# Patient Record
Sex: Female | Born: 1960 | Race: White | Hispanic: No | Marital: Married | Smoking: Former smoker
Health system: Southern US, Community
[De-identification: ages and names within clinical notes are randomized; demographics above are authoritative.]

## PROBLEM LIST (undated history)

## (undated) DIAGNOSIS — D219 Benign neoplasm of connective and other soft tissue, unspecified: Secondary | ICD-10-CM

## (undated) DIAGNOSIS — T8859XA Other complications of anesthesia, initial encounter: Secondary | ICD-10-CM

## (undated) DIAGNOSIS — Z9109 Other allergy status, other than to drugs and biological substances: Secondary | ICD-10-CM

## (undated) DIAGNOSIS — R55 Syncope and collapse: Secondary | ICD-10-CM

## (undated) DIAGNOSIS — I341 Nonrheumatic mitral (valve) prolapse: Secondary | ICD-10-CM

## (undated) DIAGNOSIS — Z973 Presence of spectacles and contact lenses: Secondary | ICD-10-CM

## (undated) HISTORY — DX: Nonrheumatic mitral (valve) prolapse: I34.1

## (undated) HISTORY — DX: Benign neoplasm of connective and other soft tissue, unspecified: D21.9

## (undated) HISTORY — DX: Syncope and collapse: R55

## (undated) HISTORY — DX: Other allergy status, other than to drugs and biological substances: Z91.09

---

## 1998-12-23 ENCOUNTER — Other Ambulatory Visit: Admission: RE | Admit: 1998-12-23 | Discharge: 1998-12-23 | Payer: Self-pay | Admitting: Gynecology

## 1999-04-26 HISTORY — PX: MYOMECTOMY: SHX85

## 1999-11-19 ENCOUNTER — Ambulatory Visit (HOSPITAL_COMMUNITY): Admission: RE | Admit: 1999-11-19 | Discharge: 1999-11-19 | Payer: Self-pay | Admitting: Gynecology

## 1999-11-19 ENCOUNTER — Encounter (INDEPENDENT_AMBULATORY_CARE_PROVIDER_SITE_OTHER): Payer: Self-pay | Admitting: Specialist

## 1999-12-06 ENCOUNTER — Other Ambulatory Visit: Admission: RE | Admit: 1999-12-06 | Discharge: 1999-12-06 | Payer: Self-pay | Admitting: Gynecology

## 2000-11-09 ENCOUNTER — Other Ambulatory Visit: Admission: RE | Admit: 2000-11-09 | Discharge: 2000-11-09 | Payer: Self-pay | Admitting: Gynecology

## 2001-12-12 ENCOUNTER — Other Ambulatory Visit: Admission: RE | Admit: 2001-12-12 | Discharge: 2001-12-12 | Payer: Self-pay | Admitting: Gynecology

## 2002-11-29 ENCOUNTER — Encounter: Payer: Self-pay | Admitting: Gynecology

## 2002-11-29 ENCOUNTER — Encounter: Admission: RE | Admit: 2002-11-29 | Discharge: 2002-11-29 | Payer: Self-pay | Admitting: Gynecology

## 2002-12-25 ENCOUNTER — Other Ambulatory Visit: Admission: RE | Admit: 2002-12-25 | Discharge: 2002-12-25 | Payer: Self-pay | Admitting: Gynecology

## 2003-12-31 ENCOUNTER — Other Ambulatory Visit: Admission: RE | Admit: 2003-12-31 | Discharge: 2003-12-31 | Payer: Self-pay | Admitting: Gynecology

## 2004-10-08 ENCOUNTER — Encounter: Admission: RE | Admit: 2004-10-08 | Discharge: 2004-10-08 | Payer: Self-pay | Admitting: Gynecology

## 2005-01-04 ENCOUNTER — Other Ambulatory Visit: Admission: RE | Admit: 2005-01-04 | Discharge: 2005-01-04 | Payer: Self-pay | Admitting: Gynecology

## 2005-12-07 ENCOUNTER — Encounter: Admission: RE | Admit: 2005-12-07 | Discharge: 2005-12-07 | Payer: Self-pay | Admitting: Gynecology

## 2006-01-03 ENCOUNTER — Other Ambulatory Visit: Admission: RE | Admit: 2006-01-03 | Discharge: 2006-01-03 | Payer: Self-pay | Admitting: Gynecology

## 2007-01-09 ENCOUNTER — Other Ambulatory Visit: Admission: RE | Admit: 2007-01-09 | Discharge: 2007-01-09 | Payer: Self-pay | Admitting: Gynecology

## 2007-10-10 ENCOUNTER — Encounter: Admission: RE | Admit: 2007-10-10 | Discharge: 2007-10-10 | Payer: Self-pay | Admitting: Gynecology

## 2008-12-24 ENCOUNTER — Emergency Department (HOSPITAL_COMMUNITY): Admission: EM | Admit: 2008-12-24 | Discharge: 2008-12-25 | Payer: Self-pay | Admitting: Emergency Medicine

## 2009-01-22 ENCOUNTER — Encounter: Admission: RE | Admit: 2009-01-22 | Discharge: 2009-01-22 | Payer: Self-pay | Admitting: Gynecology

## 2009-04-25 DIAGNOSIS — R55 Syncope and collapse: Secondary | ICD-10-CM

## 2009-04-25 HISTORY — DX: Syncope and collapse: R55

## 2010-03-29 ENCOUNTER — Encounter: Admission: RE | Admit: 2010-03-29 | Discharge: 2010-03-29 | Payer: Self-pay | Admitting: Gynecology

## 2010-05-16 ENCOUNTER — Encounter: Payer: Self-pay | Admitting: Gynecology

## 2010-07-30 LAB — POCT I-STAT, CHEM 8
BUN: 15 mg/dL (ref 6–23)
Calcium, Ion: 1.12 mmol/L (ref 1.12–1.32)
Chloride: 101 mEq/L (ref 96–112)
Creatinine, Ser: 0.7 mg/dL (ref 0.4–1.2)
Glucose, Bld: 104 mg/dL — ABNORMAL HIGH (ref 70–99)
HCT: 40 % (ref 36.0–46.0)
Hemoglobin: 13.6 g/dL (ref 12.0–15.0)
Potassium: 3.7 meq/L (ref 3.5–5.1)
Sodium: 138 mEq/L (ref 135–145)
TCO2: 26 mmol/L (ref 0–100)

## 2010-07-30 LAB — URINALYSIS, ROUTINE W REFLEX MICROSCOPIC
Glucose, UA: NEGATIVE mg/dL
Protein, ur: NEGATIVE mg/dL
pH: 5.5 (ref 5.0–8.0)

## 2010-07-30 LAB — POCT PREGNANCY, URINE: Preg Test, Ur: NEGATIVE

## 2010-07-30 LAB — URINE CULTURE: Colony Count: 5000

## 2010-08-31 ENCOUNTER — Encounter: Payer: Self-pay | Admitting: Cardiovascular Disease

## 2010-09-03 ENCOUNTER — Ambulatory Visit: Payer: Self-pay | Admitting: Cardiovascular Disease

## 2010-09-07 ENCOUNTER — Telehealth: Payer: Self-pay | Admitting: Cardiovascular Disease

## 2010-09-07 MED ORDER — PROPRANOLOL HCL 10 MG PO TABS: 10.0000 mg | ORAL_TABLET | ORAL | Status: DC | PRN

## 2010-09-07 NOTE — Telephone Encounter (Signed)
Pt wants a refill of propranolol called to [facility]

## 2010-09-10 NOTE — Op Note (Signed)
[facility]  Patient:    Tara James, Tara James                   MRN: [id number] Proc. Date: 11/19/99 Adm. Date:  [id number] Disc. Date: [id number] Attending:  Katrina Stack CC:         Gretta Cool, M.D. 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  PROCEDURE:  Hysteroscopy, resection of luminal leiomyoma x 2.  SURGEON:  Gretta Cool, M.D.  ANESTHESIA:  IV sedation and paracervical block.  DESCRIPTION OF PROCEDURE:  Under excellent IV sedation with paracervical block, the cervix was progressively dilated with a series of Pratt dilators to accommodate a 7 mm resectoscope. The endometrial cavity was then systematically examined. At the apex of the fundus, a very large fibroid bulged into the cavity.  It occupied virtually the entire upper portion of the endometrial cavity.  Photographs were taken.  It had an extremely vascular surface, lobular white leiomyoma was characteristic of garden variety fibroids.  The fibroid was then resected progressively with 90 degree resectoscope loop.  Once the entire fibroid had been removed to the base of the myometrium, a further resection was taken down into the myometrium to be certain that all of the leiomyoma tissue was removed, including its attachment into the muscle wall.  On careful examination of the remainder of the endometrial cavity, a second fibroid, just under the surface of the endometrium was located adjacent to the initial one.  The fibroid was then carefully shaved and enucleated until it was entirely removed.  There was no significant excessive bleeding.  at reduced pressure at the end of the procedure, the  bleeding was well controlled.  No significant endometrial resection was undertaken except for immediately over the leiomyomata, so as to enucleate the fibroids.  At the end of the procedure sponge and lap counts were correct.  There were no complications.  Tissue submitted to pathology. DD:  11/19/99 TD:  11/22/99 Job: [id number]

## 2010-09-16 DIAGNOSIS — R002 Palpitations: Secondary | ICD-10-CM

## 2010-09-23 ENCOUNTER — Encounter: Payer: Self-pay | Admitting: Cardiovascular Disease

## 2010-09-23 ENCOUNTER — Ambulatory Visit (INDEPENDENT_AMBULATORY_CARE_PROVIDER_SITE_OTHER): Payer: 59 | Admitting: Cardiovascular Disease

## 2010-09-23 VITALS — BP 104/74 | HR 65 | Ht 67.0 in | Wt 115.0 lb

## 2010-09-23 DIAGNOSIS — I4949 Other premature depolarization: Secondary | ICD-10-CM

## 2010-09-23 DIAGNOSIS — I341 Nonrheumatic mitral (valve) prolapse: Secondary | ICD-10-CM

## 2010-09-23 DIAGNOSIS — I059 Rheumatic mitral valve disease, unspecified: Secondary | ICD-10-CM

## 2010-09-23 DIAGNOSIS — R002 Palpitations: Secondary | ICD-10-CM

## 2010-09-23 DIAGNOSIS — I493 Ventricular premature depolarization: Secondary | ICD-10-CM | POA: Insufficient documentation

## 2010-09-23 HISTORY — DX: Nonrheumatic mitral (valve) prolapse: I34.1

## 2010-09-23 MED ORDER — PROPRANOLOL HCL 10 MG PO TABS: 10.0000 mg | ORAL_TABLET | Freq: Four times a day (QID) | ORAL | Status: DC | PRN

## 2010-09-23 NOTE — Progress Notes (Signed)
Tara James Date of Birth  June 25, 1960 [facility] [address]     [address] [place], [place]  [address] [phone]  Fax  [phone]  History of Present Illness:  Tara James is a middle-age female with a history of mitral valve prolapse and palpitations. She has had lots of palpitations which clinically sound like premature ventricular contractions.  She has not had any further episodes of syncope or presyncope. She's been quite busy at work. She has not been sleeping well. She's been under considerable stress and noticed that she's drinking too much caffeine.  Current Outpatient Prescriptions on File Prior to Visit  Medication Sig Dispense Refill  . DISCONTD: propranolol (INDERAL) 10 MG tablet Take 1 tablet (10 mg total) by mouth as needed.  15 tablet  1    Allergies  Allergen Reactions  . Penicillins     Past Medical History  Diagnosis Date  . MVP (mitral valve prolapse)   . Syncope and collapse     No past surgical history on file.  History  Smoking status  . Former Smoker  . Quit date: 08/30/1985  Smokeless tobacco  . Not on file    History  Alcohol Use No    No family history on file.  Reviw of Systems:  Reviewed in the HPI.  All other systems are negative.  Physical Exam: BP 104/74  Pulse 65  Ht 5\' 7"  (1.702 m)  Wt 115 lb (52.164 kg)  BMI 18.01 kg/m2 The patient is alert and oriented x 3.  The mood and affect are normal.  The skin is warm and dry.  Color is normal.  The HEENT exam reveals that the sclera are nonicteric.  The mucous membranes are moist.  The carotids are 2+ without bruits.  There is no thyromegaly.  There is no JVD.  The lungs are clear.  The chest wall is non tender.  The heart exam reveals a regular rate with a normal S1 and S2.  There is a soft mid systolic click.  The PMI is not displaced.   Abdominal exam reveals good bowel sounds.  There is no guarding or rebound.  There is no  hepatosplenomegaly or tenderness.  There are no masses.  Exam of the legs reveal no clubbing, cyanosis, or edema.  The legs are without rashes.  The distal pulses are intact.  Cranial nerves II - XII are intact.  Motor and sensory functions are intact.  The gait is normal.  ECG: Normal sinus rhythm. She has occasional premature ventricular contractions. Assessment / Plan:

## 2010-09-23 NOTE — Assessment & Plan Note (Signed)
Tara James presents with symptoms consistent with PVCs and was found have PVCs on her EKG. We have discussed the typical reasons for having PVCs including a thyroid abnormalities, eletrolyte abnormalities, excessive caffeine, and increased stress. Her thyroid levels were checked by her GYN doctor several months ago and they were normal. She admits to being under considerable stress because of her job. She also has not been sleeping well. We will go ahead and refill her propranolol which he can take on an as needed basis. I'll see her again in one year for followup visit. I've asked her to call me sooner if She does not improve significantly.

## 2010-09-23 NOTE — Assessment & Plan Note (Signed)
Stable

## 2011-09-29 ENCOUNTER — Encounter: Payer: Self-pay | Admitting: Cardiovascular Disease

## 2011-09-29 ENCOUNTER — Ambulatory Visit (INDEPENDENT_AMBULATORY_CARE_PROVIDER_SITE_OTHER): Payer: 59 | Admitting: Cardiovascular Disease

## 2011-09-29 VITALS — BP 110/82 | HR 65 | Ht 67.0 in | Wt 117.1 lb

## 2011-09-29 DIAGNOSIS — I493 Ventricular premature depolarization: Secondary | ICD-10-CM

## 2011-09-29 DIAGNOSIS — R5381 Other malaise: Secondary | ICD-10-CM

## 2011-09-29 DIAGNOSIS — R002 Palpitations: Secondary | ICD-10-CM

## 2011-09-29 DIAGNOSIS — R5383 Other fatigue: Secondary | ICD-10-CM

## 2011-09-29 DIAGNOSIS — I4949 Other premature depolarization: Secondary | ICD-10-CM

## 2011-09-29 LAB — CBC WITH DIFFERENTIAL/PLATELET
Eosinophils Absolute: 0.2 10*3/uL (ref 0.0–0.7)
HCT: 43.8 % (ref 36.0–46.0)
Hemoglobin: 14.7 g/dL (ref 12.0–15.0)
Lymphs Abs: 2.4 10*3/uL (ref 0.7–4.0)
MCHC: 33.7 g/dL (ref 30.0–36.0)
MCV: 94.2 fl (ref 78.0–100.0)
Monocytes Absolute: 0.5 10*3/uL (ref 0.1–1.0)
Monocytes Relative: 9.7 % (ref 3.0–12.0)
Neutrophils Relative %: 41.7 % — ABNORMAL LOW (ref 43.0–77.0)
RBC: 4.65 Mil/uL (ref 3.87–5.11)
WBC: 5.5 10*3/uL (ref 4.5–10.5)

## 2011-09-29 LAB — HEPATIC FUNCTION PANEL
ALT: 18 U/L (ref 0–35)
Albumin: 4.8 g/dL (ref 3.5–5.2)
Total Protein: 7.3 g/dL (ref 6.0–8.3)

## 2011-09-29 LAB — BASIC METABOLIC PANEL
BUN: 14 mg/dL (ref 6–23)
Calcium: 9.5 mg/dL (ref 8.4–10.5)
Creatinine, Ser: 0.6 mg/dL (ref 0.4–1.2)
GFR: 112.91 mL/min (ref >60.00)

## 2011-09-29 LAB — LIPID PANEL
Cholesterol: 181 mg/dL (ref 0–200)
HDL: 65.5 mg/dL (ref >39.00)
Triglycerides: 94 mg/dL (ref 0.0–149.0)

## 2011-09-29 NOTE — Progress Notes (Signed)
    Tara James Date of Birth  Apr 27, 1960       [facility]    [place] [address]  [address] [place], [place]  [address]   [place], [place]  [address] [phone]     [phone]   Fax  [phone]    Fax [phone]  Problem List:  1. Mitral Valve Proplapse 2. Palpitations  History of Present Illness:  Tara James presents today with increased stress , fatigue and palpitations.  She has been increased stress after her father - in -law Tara James) had a stroke.  She also has had lots of stress at work.  She exercises occasionally - has not had any chest pains or syncope with exercise.  Current Outpatient Prescriptions on File Prior to Visit  Medication Sig Dispense Refill  . CALCIUM PO Take by mouth daily.        . Cholecalciferol (VITAMIN D-3 PO) Take by mouth daily.        . Estradiol (VIVELLE TD) Place 0.75 mg onto the skin daily.       . Progesterone Micronized (PROMETRIUM PO) Take by mouth every 30 (thirty) days. Every other month       . propranolol (INDERAL) 10 MG tablet Take 1 tablet (10 mg total) by mouth 4 (four) times daily as needed.  50 tablet  12    Allergies  Allergen Reactions  . Penicillins     Past Medical History  Diagnosis Date  . MVP (mitral valve prolapse)   . Syncope and collapse     No past surgical history on file.  History  Smoking status  . Former Smoker  . Quit date: 08/30/1985  Smokeless tobacco  . Not on file    History  Alcohol Use No    No family history on file.  Reviw of Systems:  Reviewed in the HPI.  All other systems are negative.  Physical Exam: Blood pressure 110/82, pulse 65, height 5\' 7"  (1.702 m), weight 117 lb 1.9 oz (53.125 kg). General: Well developed, well nourished, in no acute distress.  Head: Normocephalic, atraumatic, sclera non-icteric, mucus membranes are moist,   Neck: Supple. Carotids are 2 + without bruits. No JVD  Lungs: Clear bilaterally to  auscultation.  Heart: regular rate.  normal  S1 S2. She has a very soft systolic murmur.  Abdomen: Soft, non-tender, non-distended with normal bowel sounds. No hepatomegaly. No rebound/guarding. No masses.  Msk:  Strength and tone are normal  Extremities: No clubbing or cyanosis. No edema.  Distal pedal pulses are 2+ and equal bilaterally.  Neuro: Alert and oriented X 3. Moves all extremities spontaneously.  Psych:  Responds to questions appropriately with a normal affect.  ECG: 09/29/2011 normal sinus rhythm. She has a vertical axis consistent with her thin  body frame. She has no ST or T wave changes.  Assessment / Plan:

## 2011-09-29 NOTE — Patient Instructions (Signed)
Your physician recommends that you return for lab work in: TODAY, TSH/BMET/CBC/LIPID/HEPATIC.   Your physician wants you to follow-up in: 1 YEAR, You will receive a reminder letter in the mail two months in advance. If you don't receive a letter, please call our office to schedule the follow-up appointment.  CALL WITH FURTHER CONCERNS

## 2011-09-29 NOTE — Assessment & Plan Note (Signed)
She continues to have palpitations that are probably due to PVCs.  She episodes last only a second or so and have not caused her to be short of breath.  We discussed placing an event monitor on her. We will check TSH, BMP, CBC and fasting lipids to further evaluate these palpitations and her generalized fatigue.

## 2011-10-05 ENCOUNTER — Encounter: Payer: Self-pay | Admitting: Internal Medicine

## 2011-10-05 ENCOUNTER — Ambulatory Visit (INDEPENDENT_AMBULATORY_CARE_PROVIDER_SITE_OTHER): Payer: 59 | Admitting: Internal Medicine

## 2011-10-05 VITALS — BP 100/68 | HR 57 | Temp 97.5°F | Resp 16 | Ht 67.0 in | Wt 115.0 lb

## 2011-10-05 DIAGNOSIS — E8941 Symptomatic postprocedural ovarian failure: Secondary | ICD-10-CM

## 2011-10-05 DIAGNOSIS — R002 Palpitations: Secondary | ICD-10-CM

## 2011-10-05 DIAGNOSIS — F430 Acute stress reaction: Secondary | ICD-10-CM

## 2011-10-05 DIAGNOSIS — I341 Nonrheumatic mitral (valve) prolapse: Secondary | ICD-10-CM

## 2011-10-05 DIAGNOSIS — I059 Rheumatic mitral valve disease, unspecified: Secondary | ICD-10-CM

## 2011-10-05 DIAGNOSIS — F439 Reaction to severe stress, unspecified: Secondary | ICD-10-CM

## 2011-10-05 MED ORDER — ALPRAZOLAM 0.5 MG PO TABS: 0.2500 mg | ORAL_TABLET | Freq: Every evening | ORAL | Status: DC | PRN

## 2011-10-06 ENCOUNTER — Encounter: Payer: Self-pay | Admitting: Internal Medicine

## 2011-10-06 DIAGNOSIS — R002 Palpitations: Secondary | ICD-10-CM | POA: Insufficient documentation

## 2011-10-06 DIAGNOSIS — Z780 Asymptomatic menopausal state: Secondary | ICD-10-CM | POA: Insufficient documentation

## 2011-10-06 NOTE — Progress Notes (Signed)
Subjective:    Patient ID: Tara James, female    DOB: [birth year], 51 y.o.   MRN: [id number]  HPI  Tara James is a new pt here to establish primary care.  PMH of MVP, palpitations , symptomatic menopause,  Jacki Cones notes she does have abd discomfort that she wonders if it is related to IBS.  She has recently eliminated gluten from her diet.    She has been under considerable stress at work.  There have been layoffs and she is working many hours.    She has been on HT form Dr.Lomax for about 2 years.   MGM had breast cancer, no other GYN cancers, no DVT or PE Father had CAD .  She has palpitations at time and notes she does drink caffiene.  No chest pain no SOD or DOE  Allergies  Allergen Reactions  . Penicillins    Past Medical History  Diagnosis Date  . MVP (mitral valve prolapse)   . Syncope and collapse   . Allergy to poison ivy   . Fibroid tumor    Past Surgical History  Procedure Date  . Myomectomy 2001   History   Social History  . Marital Status: Married    Spouse Name: N/A    Number of Children: N/A  . Years of Education: N/A   Occupational History  . Not on file.   Social History Main Topics  . Smoking status: Former Smoker -- 0.5 packs/day for 4 years    Types: Cigarettes    Quit date: 08/30/1985  . Smokeless tobacco: Never Used  . Alcohol Use: Yes     wine  . Drug Use: No  . Sexually Active: Yes -- Female partner(s)   Other Topics Concern  . Not on file   Social History Narrative  . No narrative on file   Family History  Problem Relation Age of Onset  . Diabetes Paternal Grandmother   . Heart disease Father   . Cancer Maternal Grandmother     breast   Patient Active Problem List  Diagnosis  . Premature ventricular contraction  . Mitral valve prolapse  . Menopause  . Palpitations   Current Outpatient Prescriptions on File Prior to Visit  Medication Sig Dispense Refill  . CALCIUM PO Take by mouth daily.        . Cholecalciferol (VITAMIN  D-3 PO) Take by mouth daily.        . Estradiol (VIVELLE TD) Place 0.75 mg onto the skin 2 (two) times a week.       . Progesterone Micronized (PROMETRIUM PO) Take by mouth. Ever 12 days a month every other month      . propranolol (INDERAL) 10 MG tablet Take 1 tablet (10 mg total) by mouth 4 (four) times daily as needed.  50 tablet  12      Review of Systems    see HPI Objective:   Physical Exam Physical Exam  Nursing note and vitals reviewed.  Constitutional: She is oriented to person, place, and time. She appears well-developed and well-nourished.  HENT:  Head: Normocephalic and atraumatic.  Cardiovascular: Normal rate and regular rhythm. Exam reveals no gallop and no friction rub.  No murmur heard.  Pulmonary/Chest: Breath sounds normal. She has no wheezes. She has no rales.  Neurological: She is alert and oriented to person, place, and time.  Skin: Skin is warm and dry.  Psychiatric: She has a normal mood and affect. Her behavior is normal.  Assessment & Plan:  1)  MVP/palpitations  Managed Dr. Melburn Popper  2)  Situational stress  3)  Menpause

## 2012-07-23 ENCOUNTER — Encounter: Payer: Self-pay | Admitting: Cardiovascular Disease

## 2012-07-28 ENCOUNTER — Encounter: Payer: Self-pay | Admitting: Internal Medicine

## 2012-07-28 DIAGNOSIS — E2839 Other primary ovarian failure: Secondary | ICD-10-CM | POA: Insufficient documentation

## 2012-07-28 DIAGNOSIS — D219 Benign neoplasm of connective and other soft tissue, unspecified: Secondary | ICD-10-CM | POA: Insufficient documentation

## 2012-08-06 ENCOUNTER — Encounter: Payer: Self-pay | Admitting: *Deleted

## 2012-08-13 ENCOUNTER — Telehealth: Payer: Self-pay | Admitting: Internal Medicine

## 2012-08-13 ENCOUNTER — Encounter: Payer: Self-pay | Admitting: Internal Medicine

## 2012-08-13 NOTE — Telephone Encounter (Signed)
Spoke with pt and counseled to get her mammogram as in old records from Dr. Nicholas Lose   I do not see a mammogram since 2011.  Pt voices understanding and counseled to have the [facility] send the mammogram report to me

## 2012-08-28 ENCOUNTER — Encounter: Payer: Self-pay | Admitting: *Deleted

## 2012-11-13 ENCOUNTER — Telehealth: Payer: Self-pay | Admitting: Cardiovascular Disease

## 2012-11-13 DIAGNOSIS — R002 Palpitations: Secondary | ICD-10-CM

## 2012-11-13 NOTE — Telephone Encounter (Signed)
Called pt 7/23 appt for event moniter.  10:30am  [facility] [facility] [facility] status: Elapsed Subscriber: Cecilie Kicks

## 2012-11-13 NOTE — Telephone Encounter (Signed)
New problem   Pt spoke to someone in triage earlier and has another question

## 2012-11-13 NOTE — Telephone Encounter (Signed)
New prob  Pt states that she has been having occasional palpitations. She said that she is having arrhythmias with a resting pulse of 42.

## 2012-11-13 NOTE — Telephone Encounter (Signed)
Patient called back. States she has been having skipped beats and palpitations on and off since Sunday. On some days she has no symptoms. Takes Inderal prn but these events are becoming bothersome to her. Saw Dr.Nahser last year for a work up and he mentioned having an event monitor which she did not complete last year. Advised I will place order for and event monitor and she will receive a call back from [facility] to set this up. I made her a return visit appointment with Dr.Nahser for 8/14 at 845 am. She was instructed to report to the ER if she has any dizziness or pre syncope with the skipped beats/palpitations.

## 2012-11-13 NOTE — Telephone Encounter (Signed)
Left message for call back.

## 2012-11-13 NOTE — Telephone Encounter (Signed)
I spoke with the pt and she said that she had to leave work due to skipped beats.  The pt has not taken Inderal at this time and was unsure if this is the appropriate time to take the medication.  I made the pt aware that it is appropriate to take the Inderal as prescribed. An order has been placed for an event monitor from her earlier call and she is scheduled to see Dr Elease Hashimoto.

## 2012-11-14 ENCOUNTER — Encounter: Payer: Self-pay | Admitting: *Deleted

## 2012-11-14 ENCOUNTER — Encounter (INDEPENDENT_AMBULATORY_CARE_PROVIDER_SITE_OTHER): Payer: 59

## 2012-11-14 DIAGNOSIS — R002 Palpitations: Secondary | ICD-10-CM

## 2012-11-14 NOTE — Progress Notes (Signed)
Patient ID: Tara James, female   DOB: 1960-05-23, 52 y.o.   MRN: [id number] E-cardio Bramer 30 day cardiac event monitor placed on patient.

## 2012-11-30 ENCOUNTER — Telehealth: Payer: Self-pay | Admitting: *Deleted

## 2012-11-30 NOTE — Telephone Encounter (Signed)
msg left to see if pt feels the fast heart rates seen on ecardio @170  bpm and wanted to see if she is using her propranolol? Asked her to call back and update me.

## 2012-12-03 ENCOUNTER — Telehealth: Payer: Self-pay | Admitting: Cardiovascular Disease

## 2012-12-03 NOTE — Telephone Encounter (Signed)
New prob  Pt returning a phone call from jodette

## 2012-12-03 NOTE — Telephone Encounter (Signed)
Pt has not been using the propranolol, she feels bad and wasn't sure if it was the med. On 11/29/12 @ 5:30 she was playing tennis. She has an app this week to discuss readings, pt is to wear the monitor for 1 more week.

## 2012-12-06 ENCOUNTER — Encounter: Payer: Self-pay | Admitting: Cardiovascular Disease

## 2012-12-06 ENCOUNTER — Telehealth: Payer: Self-pay | Admitting: Cardiovascular Disease

## 2012-12-06 ENCOUNTER — Ambulatory Visit (INDEPENDENT_AMBULATORY_CARE_PROVIDER_SITE_OTHER): Payer: 59 | Admitting: Cardiovascular Disease

## 2012-12-06 VITALS — BP 110/73 | HR 61 | Wt 119.0 lb

## 2012-12-06 DIAGNOSIS — R002 Palpitations: Secondary | ICD-10-CM

## 2012-12-06 DIAGNOSIS — R0989 Other specified symptoms and signs involving the circulatory and respiratory systems: Secondary | ICD-10-CM

## 2012-12-06 DIAGNOSIS — I059 Rheumatic mitral valve disease, unspecified: Secondary | ICD-10-CM

## 2012-12-06 DIAGNOSIS — I341 Nonrheumatic mitral (valve) prolapse: Secondary | ICD-10-CM

## 2012-12-06 LAB — CBC
HCT: 42.4 % (ref 36.0–46.0)
Hemoglobin: 14.5 g/dL (ref 12.0–15.0)
MCV: 91.3 fl (ref 78.0–100.0)
Platelets: 171 10*3/uL (ref 150.0–400.0)
RDW: 12.2 % (ref 11.5–14.6)
WBC: 5 10*3/uL (ref 4.5–10.5)

## 2012-12-06 LAB — BASIC METABOLIC PANEL
BUN: 17 mg/dL (ref 6–23)
CO2: 30 mEq/L (ref 19–32)
Creatinine, Ser: 0.6 mg/dL (ref 0.4–1.2)
GFR: 112.36 mL/min (ref >60.00)
Glucose, Bld: 83 mg/dL (ref 70–99)
Potassium: 4.2 mEq/L (ref 3.5–5.1)
Sodium: 137 mEq/L (ref 135–145)

## 2012-12-06 LAB — HEPATIC FUNCTION PANEL
ALT: 18 U/L (ref 0–35)
Total Protein: 7.1 g/dL (ref 6.0–8.3)

## 2012-12-06 NOTE — Patient Instructions (Signed)
Your physician has requested that you have an abdominal aorta duplex. During this test, an ultrasound is used to evaluate the aorta. Allow 30 minutes for this exam. Do not eat after midnight the day before and avoid carbonated beverages  Your physician has requested that you have an echocardiogram. Echocardiography is a painless test that uses sound waves to create images of your heart. It provides your doctor with information about the size and shape of your heart and how well your heart's chambers and valves are working. This procedure takes approximately one hour. There are no restrictions for this procedure.  Your physician recommends that you schedule a follow-up appointment in: 2 months   Your physician recommends that you return for lab work in: today tsh/bmet/hfp/ cbc

## 2012-12-06 NOTE — Telephone Encounter (Signed)
Pt's TSH is low suggesting that her symptoms are due to hyperthyroidism.   Needs referral to endocrinologist.

## 2012-12-06 NOTE — Progress Notes (Signed)
Tara James Date of Birth  [birth year]       [facility]    [place] [address]  [address] [place], [place]  [address]   [place], [place]  [address] [phone]     [phone]   Fax  [phone]    Fax [phone]  Problem List:  1. Mitral Valve Proplapse 2. Palpitations 3. Premature ventricular contractions.  History of Present Illness:  Tara James presents today with increased stress , fatigue and palpitations.  She has been increased stress after her father - in -law Tara James) had a stroke.  She also has had lots of stress at work.  She exercises occasionally - has not had any chest pains or syncope with exercise.  December 06, 2012:  Tara James presents today for follow up of her palpitations.  She has worn the 30 day event monitor which shows many PVCs.  She has not been running but has played tennis - she felt "horrible"  Yesterday, she had significant dyspne and fatigue - even doing very mild exercise such as walking the dog.    Current Outpatient Prescriptions on File Prior to Visit  Medication Sig Dispense Refill  . ALPRAZolam (XANAX) 0.5 MG tablet Take 0.5 tablets (0.25 mg total) by mouth at bedtime as needed.  10 tablet  0  . CALCIUM PO Take by mouth daily.        . Cholecalciferol (VITAMIN D-3 PO) Take by mouth daily.        . Estradiol (VIVELLE TD) Place 0.75 mg onto the skin 2 (two) times a week.       . Progesterone Micronized (PROMETRIUM PO) Take by mouth. Ever 12 days a month every other month      . propranolol (INDERAL) 10 MG tablet Take 1 tablet (10 mg total) by mouth 4 (four) times daily as needed.  50 tablet  12   No current facility-administered medications on file prior to visit.    Allergies  Allergen Reactions  . Penicillins     Past Medical History  Diagnosis Date  . MVP (mitral valve prolapse)   . Syncope and collapse   . Allergy to poison ivy   . Fibroid tumor     Past Surgical History   Procedure Laterality Date  . Myomectomy  2001    History  Smoking status  . Former Smoker -- 0.50 packs/day for 4 years  . Types: Cigarettes  . Quit date: 08/30/1985  Smokeless tobacco  . Never Used    History  Alcohol Use  . Yes    Comment: wine    Family History  Problem Relation Age of Onset  . Diabetes Paternal Grandmother   . Heart disease Father   . Cancer Maternal Grandmother     breast    Reviw of Systems:  Reviewed in the HPI.  All other systems are negative.  Physical Exam: Blood pressure 110/73, pulse 61, weight 119 lb (53.978 kg). General: Well developed, well nourished, in no acute distress.  Head: Normocephalic, atraumatic, sclera non-icteric, mucus membranes are moist,   Neck: Supple. Carotids are 2 + without bruits. No JVD  Lungs: Clear bilaterally to auscultation.  Heart: regular rate.  normal  S1 S2. She has a very soft systolic murmur.  Abdomen: Soft, non-tender, non-distended with normal bowel sounds.  Thin, + palpable abdomina aorta.  + soft bruit.  Msk:  Strength and tone are normal  Extremities: No clubbing or cyanosis.  No edema.  Distal pedal pulses are 2+ and equal bilaterally.  Neuro: Alert and oriented X 3. Moves all extremities spontaneously.  Psych:  Responds to questions appropriately with a normal affect.  ECG: 09/29/2011 normal sinus rhythm. She has a vertical axis consistent with her thin  body frame. She has no ST or T wave changes.  Assessment / Plan:

## 2012-12-06 NOTE — Assessment & Plan Note (Signed)
Part presents today for further evaluation of her palpitations. She's been wearing a 30 day event monitor. She's been found to have frequent premature ventricular contractions. She has also noticed that she is more fatigued than usual and is occasionally short of breath with exercise that typically she does without problems.  The event monitor his been unremarkable so far. She's had occasional episodes/frequent episodes of premature ventricular contractions. She has not had any serious arrhythmias.  I suspect that she may be somewhat volume depleted. I've encouraged her to eat a little bit more food including more protein and more salt/potassium. Encouraged her to try eating a hard boiled egg with salt or an egg  white every morning for breakfast. I've recommended that she drink V8 juice in the afternoon.  It's been approximately 4 years since her last echocardiogram. We will repeat her echocardiogram to make sure that her mitral valve is stable. On exam it sounds the same as it did in the past.

## 2012-12-06 NOTE — Assessment & Plan Note (Signed)
I was able to hear a midline abdominal bruit. She's quite thin and it may be that  i am  just hearing the flow through the normal aorta. I would like to do  an abdominal duplex can't rule out renal artery stenosis or other arterial stenosis.  I doubt that she has an Abdominal aortic aneurysm despite the fact that I can palpate her abdominal bruit.

## 2012-12-10 ENCOUNTER — Telehealth: Payer: Self-pay | Admitting: *Deleted

## 2012-12-10 NOTE — Telephone Encounter (Signed)
Dr Elease Hashimoto contacted pt last week with TSH results and made her an app w/ [facility] for the following day, echo was cancelled per Dr Elease Hashimoto but will have abd duplex. Pt was called, she had app last week and more labs were done to see if this was a thyroiditis that would need treatment. mshe will hear further this week. Pt still c/o fatigue and sob with exertion.

## 2012-12-14 ENCOUNTER — Other Ambulatory Visit (HOSPITAL_COMMUNITY): Payer: 59

## 2012-12-14 ENCOUNTER — Encounter (INDEPENDENT_AMBULATORY_CARE_PROVIDER_SITE_OTHER): Payer: 59

## 2012-12-14 DIAGNOSIS — I341 Nonrheumatic mitral (valve) prolapse: Secondary | ICD-10-CM

## 2012-12-14 DIAGNOSIS — R002 Palpitations: Secondary | ICD-10-CM

## 2012-12-14 DIAGNOSIS — R0989 Other specified symptoms and signs involving the circulatory and respiratory systems: Secondary | ICD-10-CM

## 2013-01-16 ENCOUNTER — Other Ambulatory Visit: Payer: Self-pay

## 2013-01-16 DIAGNOSIS — Z1231 Encounter for screening mammogram for malignant neoplasm of breast: Secondary | ICD-10-CM

## 2013-02-12 ENCOUNTER — Ambulatory Visit
Admission: RE | Admit: 2013-02-12 | Discharge: 2013-02-12 | Disposition: A | Discharge status: Home / Self Care | Payer: 59 | Source: Ambulatory Visit

## 2013-02-12 DIAGNOSIS — Z1231 Encounter for screening mammogram for malignant neoplasm of breast: Secondary | ICD-10-CM

## 2013-02-15 ENCOUNTER — Encounter: Payer: Self-pay | Admitting: Cardiovascular Disease

## 2013-02-15 ENCOUNTER — Ambulatory Visit (INDEPENDENT_AMBULATORY_CARE_PROVIDER_SITE_OTHER): Payer: 59 | Admitting: Cardiovascular Disease

## 2013-02-15 ENCOUNTER — Encounter (INDEPENDENT_AMBULATORY_CARE_PROVIDER_SITE_OTHER): Payer: Self-pay

## 2013-02-15 VITALS — BP 110/74 | HR 54 | Ht 62.0 in | Wt 123.0 lb

## 2013-02-15 DIAGNOSIS — I341 Nonrheumatic mitral (valve) prolapse: Secondary | ICD-10-CM

## 2013-02-15 DIAGNOSIS — I059 Rheumatic mitral valve disease, unspecified: Secondary | ICD-10-CM

## 2013-02-15 DIAGNOSIS — R002 Palpitations: Secondary | ICD-10-CM

## 2013-02-15 DIAGNOSIS — R0989 Other specified symptoms and signs involving the circulatory and respiratory systems: Secondary | ICD-10-CM

## 2013-02-15 MED ORDER — PROPRANOLOL HCL 10 MG PO TABS: 10.0000 mg | ORAL_TABLET | Freq: Four times a day (QID) | ORAL | Status: DC | PRN

## 2013-02-15 NOTE — Patient Instructions (Signed)
Your physician wants you to follow-up in: 1 year  You will receive a reminder letter in the mail two months in advance. If you don't receive a letter, please call our office to schedule the follow-up appointment.  Your physician recommends that you continue on your current medications as directed. Please refer to the Current Medication list given to you today.  

## 2013-02-15 NOTE — Assessment & Plan Note (Signed)
stable °

## 2013-02-15 NOTE — Assessment & Plan Note (Signed)
Tara James is stable.  Will continue current meds.  I will see her again in 1 year.

## 2013-02-15 NOTE — Progress Notes (Signed)
Tara James Date of Birth  [birth year]       [facility]    [place] [address], [address]  [address] [place], [place]  [address]   [place], [place]  [address] [phone]     [phone]   Fax  [phone]    Fax [phone]  Problem List:  1. Mitral Valve Proplapse 2. Palpitations 3. Premature ventricular contractions.  History of Present Illness:  Tara James presents today with increased stress , fatigue and palpitations.  She has been increased stress after her father - in -law Tara James) had a stroke.  She also has had lots of stress at work.  She exercises occasionally - has not had any chest pains or syncope with exercise.  December 06, 2012:  Tara James presents today for follow up of her palpitations.  She has worn the 30 day event monitor which shows many PVCs.  She has not been running but has played tennis - she felt "horrible"  Yesterday, she had significant dyspne and fatigue - even doing very mild exercise such as walking the dog.    Oct. 24, 2014:  Tara James is doing better.  She had an abdominal duplex scan after her last visit because of an abd. Bruit - the scan was negative.   Current Outpatient Prescriptions on File Prior to Visit  Medication Sig Dispense Refill  . ALPRAZolam (XANAX) 0.5 MG tablet Take 0.5 tablets (0.25 mg total) by mouth at bedtime as needed.  10 tablet  0  . CALCIUM PO Take by mouth daily.        . Cholecalciferol (VITAMIN D-3 PO) Take by mouth daily.        . Estradiol (VIVELLE TD) Place 0.75 mg onto the skin 2 (two) times a week.       . Progesterone Micronized (PROMETRIUM PO) Take by mouth. Ever 12 days a month every other month      . propranolol (INDERAL) 10 MG tablet Take 1 tablet (10 mg total) by mouth 4 (four) times daily as needed.  50 tablet  12   No current facility-administered medications on file prior to visit.    Allergies  Allergen Reactions  . Penicillins     Past Medical History   Diagnosis Date  . MVP (mitral valve prolapse)   . Syncope and collapse   . Allergy to poison ivy   . Fibroid tumor     Past Surgical History  Procedure Laterality Date  . Myomectomy  2001    History  Smoking status  . Former Smoker -- 0.50 packs/day for 4 years  . Types: Cigarettes  . Quit date: 08/30/1985  Smokeless tobacco  . Never Used    History  Alcohol Use  . Yes    Comment: wine    Family History  Problem Relation Age of Onset  . Diabetes Paternal Grandmother   . Heart disease Father   . Cancer Maternal Grandmother     breast    Reviw of Systems:  Reviewed in the HPI.  All other systems are negative.  Physical Exam: Blood pressure 110/74, pulse 54, height 5\' 2"  (1.575 m), weight 123 lb (55.792 kg). General: Well developed, well nourished, in no acute distress.  Head: Normocephalic, atraumatic, sclera non-icteric, mucus membranes are moist,   Neck: Supple. Carotids are 2 + without bruits. No JVD  Lungs: Clear bilaterally to auscultation.  Heart: regular rate.  normal  S1 S2. She has a very  soft systolic murmur.  Abdomen: Soft, non-tender, non-distended with normal bowel sounds.  Thin, + palpable abdomina aorta.  + soft bruit.  Msk:  Strength and tone are normal  Extremities: No clubbing or cyanosis. No edema.  Distal pedal pulses are 2+ and equal bilaterally.  Neuro: Alert and oriented X 3. Moves all extremities spontaneously.  Psych:  Responds to questions appropriately with a normal affect.  ECG: 09/29/2011 normal sinus rhythm. She has a vertical axis consistent with her thin  body frame. She has no ST or T wave changes.  Assessment / Plan:

## 2013-02-15 NOTE — Assessment & Plan Note (Signed)
She is very thin and I am able to hear a soft abdomal bruit.  The abd. Duplex is negative.

## 2013-03-02 ENCOUNTER — Other Ambulatory Visit: Payer: Self-pay | Admitting: Cardiology

## 2013-03-02 DIAGNOSIS — F40243 Fear of flying: Secondary | ICD-10-CM

## 2013-03-02 DIAGNOSIS — F419 Anxiety disorder, unspecified: Secondary | ICD-10-CM

## 2013-03-02 MED ORDER — ALPRAZOLAM 0.5 MG PO TABS: 0.2500 mg | ORAL_TABLET | Freq: Every evening | ORAL | Status: DC | PRN

## 2013-03-02 NOTE — Progress Notes (Signed)
Patient called regarding refill of Xanax. She has anxiety and fear of flying. Dr. Elease Hashimoto has prescribed short term presriptions for Xanax in the past as he knows her well and follows her regularly. Patient states she is flying out of the country tomorrow and needs a refill on Xanax. I spoke with her primary cardiologist, Dr. Elease Hashimoto, who is on call with me today. Dr. Elease Hashimoto stated he is willing to provide her with a refill for her upcoming trip. He has done this for her in the past and follows her regularly. She has been counseled regarding the use of Xanax. A prescription refill was sent to her pharmacy. Xanax 0.5 mg - take 1/2 tablet daily at bedtime as needed. #10 (ten). No (0) refills.

## 2013-07-08 ENCOUNTER — Telehealth: Payer: Self-pay | Admitting: *Deleted

## 2013-07-08 ENCOUNTER — Other Ambulatory Visit (INDEPENDENT_AMBULATORY_CARE_PROVIDER_SITE_OTHER): Payer: 59

## 2013-07-08 ENCOUNTER — Telehealth: Payer: Self-pay | Admitting: Cardiovascular Disease

## 2013-07-08 DIAGNOSIS — N920 Excessive and frequent menstruation with regular cycle: Secondary | ICD-10-CM

## 2013-07-08 DIAGNOSIS — R002 Palpitations: Secondary | ICD-10-CM

## 2013-07-08 NOTE — Telephone Encounter (Signed)
Chart reviewed/ pt states x 1 week she has had frequent palpitations. Pt has not had TSH checked recently, she will call her PCP and get a lab draw. Pt was told to call with further need, she agreed to plan.

## 2013-07-08 NOTE — Telephone Encounter (Signed)
See prior note// pt was told to call pcp because last year her tsh was abn. Her event monitor was normal / freq PVC's. She called and they wouldn't get labs until she had an app and they couldn't get her in soon. I made her a lab app today/ bmet, TSH, cbc.  She denies other sx like sob but has had a menses which is unusual for her/ has not had one for awhile.

## 2013-07-08 NOTE — Telephone Encounter (Signed)
New message      Having same symptoms as last summer----heart palpitations--need advise

## 2013-07-09 LAB — BASIC METABOLIC PANEL
BUN: 19 mg/dL (ref 6–23)
CHLORIDE: 100 meq/L (ref 96–112)
CO2: 35 meq/L — AB (ref 19–32)
CREATININE: 0.7 mg/dL (ref 0.4–1.2)
Calcium: 9.3 mg/dL (ref 8.4–10.5)
GFR: 102.21 mL/min (ref >60.00)
GLUCOSE: 102 mg/dL — AB (ref 70–99)
Potassium: 3.7 mEq/L (ref 3.5–5.1)
Sodium: 137 mEq/L (ref 135–145)

## 2013-07-09 LAB — CBC
HEMATOCRIT: 41.1 % (ref 36.0–46.0)
HEMOGLOBIN: 13.9 g/dL (ref 12.0–15.0)
MCHC: 33.9 g/dL (ref 30.0–36.0)
MCV: 93.3 fl (ref 78.0–100.0)
Platelets: 176 10*3/uL (ref 150.0–400.0)
RBC: 4.41 Mil/uL (ref 3.87–5.11)
RDW: 12.4 % (ref 11.5–14.6)
WBC: 8.2 10*3/uL (ref 4.5–10.5)

## 2013-07-09 LAB — TSH: TSH: 1.61 u[IU]/mL (ref 0.35–5.50)

## 2013-07-09 NOTE — Telephone Encounter (Signed)
**Note De-Identified Tara James Obfuscation** Preliminarally discussed results with the pt. She is aware that Dr Acie Fredrickson has not reviewed her lab results yet.

## 2013-07-09 NOTE — Telephone Encounter (Signed)
Her TSH is now normal

## 2013-07-09 NOTE — Telephone Encounter (Signed)
New message  ° ° °Patient calling for test results.   °

## 2013-07-10 ENCOUNTER — Telehealth: Payer: Self-pay | Admitting: Cardiovascular Disease

## 2013-07-10 NOTE — Telephone Encounter (Signed)
New message ° ° ° ° ° °Returning a nurses call °

## 2013-07-10 NOTE — Telephone Encounter (Signed)
MSG LEFT/ labs are normal, continue to take propranolol, can call to schedule an app/ we can fit into schedule.

## 2013-07-10 NOTE — Telephone Encounter (Signed)
Pt will continue with propranolol daily while having this increased amount of palpitations.  Pt is not consuming caffeine and is not under additional stress, she states the palpitations are just annoying. She declines app at this time and declines using a beta blocker on a daily use. Pt will call when she would like to be seen or with further questions or concerns.

## 2013-07-31 ENCOUNTER — Ambulatory Visit (INDEPENDENT_AMBULATORY_CARE_PROVIDER_SITE_OTHER): Payer: 59 | Admitting: Internal Medicine

## 2013-07-31 ENCOUNTER — Encounter: Payer: Self-pay | Admitting: Internal Medicine

## 2013-07-31 VITALS — BP 107/60 | HR 75 | Temp 98.2°F | Resp 18 | Ht 67.0 in | Wt 121.0 lb

## 2013-07-31 DIAGNOSIS — Z139 Encounter for screening, unspecified: Secondary | ICD-10-CM

## 2013-07-31 DIAGNOSIS — R922 Inconclusive mammogram: Secondary | ICD-10-CM | POA: Insufficient documentation

## 2013-07-31 DIAGNOSIS — Z0000 Encounter for general adult medical examination without abnormal findings: Secondary | ICD-10-CM

## 2013-07-31 DIAGNOSIS — N951 Menopausal and female climacteric states: Secondary | ICD-10-CM

## 2013-07-31 DIAGNOSIS — Z780 Asymptomatic menopausal state: Secondary | ICD-10-CM

## 2013-07-31 DIAGNOSIS — N6459 Other signs and symptoms in breast: Secondary | ICD-10-CM

## 2013-07-31 DIAGNOSIS — N950 Postmenopausal bleeding: Secondary | ICD-10-CM | POA: Insufficient documentation

## 2013-07-31 LAB — POCT URINALYSIS DIPSTICK
Bilirubin, UA: NEGATIVE
GLUCOSE UA: NEGATIVE
Leukocytes, UA: NEGATIVE
Nitrite, UA: NEGATIVE
Protein, UA: NEGATIVE
RBC UA: NEGATIVE
SPEC GRAV UA: 1.01
UROBILINOGEN UA: NEGATIVE
pH, UA: 7

## 2013-07-31 LAB — HEMOCCULT GUIAC POC 1CARD (OFFICE): Fecal Occult Blood, POC: NEGATIVE

## 2013-07-31 MED ORDER — ESTRADIOL 0.075 MG/24HR TD PTTW: 1.0000 | MEDICATED_PATCH | TRANSDERMAL | Status: DC

## 2013-07-31 MED ORDER — PROGESTERONE MICRONIZED 100 MG PO CAPS: 100.0000 mg | ORAL_CAPSULE | Freq: Every day | ORAL | Status: DC

## 2013-07-31 NOTE — Progress Notes (Signed)
Subjective:    Patient ID: Tara James, female    DOB: 12-12-1960, 53 y.o.   MRN: [id number]  HPI  [name] is here for CPE    HM:  MM shows extreme breast density  .  She has no FH of breast cancer.  She is due for first screening colonoscopy    Had some frequent palpitations over summer.  Event moniter ([name]) showed PVC"S and TSH supressed at that time.  Repeat TSH just 3 weeks ago was normal on repeat testing .  She is asymptomatic now  In 05/2013 had vaginal bleeding for 5 days  LMP "years ago"    She reports she had post-menopausal FSH done by [name] in past.    She is on HT and taking prometrium for 12 days qomonth.  She has been on HY for about 3 years for bothersome menopausal flushing.   She reports her prior pap smears have been normal for many years  Allergies  Allergen Reactions  . Penicillins    Past Medical History  Diagnosis Date  . MVP (mitral valve prolapse)   . Syncope and collapse   . Allergy to poison ivy   . Fibroid tumor    Past Surgical History  Procedure Laterality Date  . Myomectomy  2001   History   Social History  . Marital Status: Married    Spouse Name: N/A    Number of Children: N/A  . Years of Education: N/A   Occupational History  . Not on file.   Social History Main Topics  . Smoking status: Former Smoker -- 0.50 packs/day for 4 years    Types: Cigarettes    Quit date: 08/30/1985  . Smokeless tobacco: Never Used  . Alcohol Use: Yes     Comment: wine  . Drug Use: No  . Sexual Activity: Yes    Partners: Male   Other Topics Concern  . Not on file   Social History Narrative  . No narrative on file   Family History  Problem Relation Age of Onset  . Diabetes Paternal Grandmother   . Heart disease Father   . Cancer Maternal Grandmother     breast   Patient Active Problem List   Diagnosis Date Noted  . Post-menopausal bleeding 07/31/2013  . Abdominal bruit 12/06/2012  . 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  Fibroids 07/28/2012  . Menopause 10/06/2011  . Palpitations 10/06/2011  . Premature ventricular contraction 09/23/2010  . Mitral valve prolapse 09/23/2010   Current Outpatient Prescriptions on File Prior to Visit  Medication Sig Dispense Refill  . CALCIUM PO Take by mouth daily.        . Cholecalciferol (VITAMIN D-3 PO) Take by mouth daily.        . Progesterone Micronized (PROMETRIUM PO) Take by mouth. Ever 12 days a month every other month      . propranolol (INDERAL) 10 MG tablet Take 1 tablet (10 mg total) by mouth 4 (four) times daily as needed.  50 tablet  12  . ALPRAZolam (XANAX) 0.5 MG tablet Take 0.5 tablets (0.25 mg total) by mouth at bedtime as needed.  10 tablet  0  . Estradiol (VIVELLE TD) Place 0.75 mg onto the skin 2 (two) times a week.        No current facility-administered medications on file prior to visit.       Review of Systems  Respiratory: Negative for choking and chest tightness.   Cardiovascular: Negative for  chest pain, palpitations and leg swelling.  Gastrointestinal: Negative for abdominal pain.  All other systems reviewed and are negative.      Objective:   Physical Exam  Physical Exam  Nursing note and vitals reviewed.  Constitutional: She is oriented to person, place, and time. She appears well-developed and well-nourished.  HENT:  Head: Normocephalic and atraumatic.  Right Ear: Tympanic membrane and ear canal normal. No drainage. Tympanic membrane is not injected and not erythematous.  Left Ear: Tympanic membrane and ear canal normal. No drainage. Tympanic membrane is not injected and not erythematous.  Nose: Nose normal. Right sinus exhibits no maxillary sinus tenderness and no frontal sinus tenderness. Left sinus exhibits no maxillary sinus tenderness and no frontal sinus tenderness.  Mouth/Throat: Oropharynx is clear and moist. No oral lesions. No oropharyngeal exudate.  Eyes: Conjunctivae and EOM are normal. Pupils are equal, round, and  reactive to light.  Neck: Normal range of motion. Neck supple. No JVD present. Carotid bruit is not present. No mass and no thyromegaly present.  Cardiovascular: Normal rate, regular rhythm, S1 normal, S2 normal and intact distal pulses. Exam reveals no gallop and no friction rub.  No murmur heard.  Pulses:  Carotid pulses are 2+ on the right side, and 2+ on the left side.  Dorsalis pedis pulses are 2+ on the right side, and 2+ on the left side.  No carotid bruit. No LE edema  Pulmonary/Chest: Breath sounds normal. She has no wheezes. She has no rales. She exhibits no tenderness.   Breast no discrete mass no nipple discharge no axillary adenopathy bilaterally Abdominal: Soft. Bowel sounds are normal. She exhibits no distension and no mass. There is no hepatosplenomegaly. There is no tenderness. There is no CVA tenderness.   Rectal no mass guaiac neg Musculoskeletal: Normal range of motion.  No active synovitis to joints.  Lymphadenopathy:  She has no cervical adenopathy.  She has no axillary adenopathy.  Right: No inguinal and no supraclavicular adenopathy present.  Left: No inguinal and no supraclavicular adenopathy present.  Neurological: She is alert and oriented to person, place, and time. She has normal strength and normal reflexes. She displays no tremor. No cranial nerve deficit or sensory deficit. Coordination and gait normal.  Skin: Skin is warm and dry. No rash noted. No cyanosis. Nails show no clubbing.  Psychiatric: She has a normal mood and affect. Her speech is normal and behavior is normal. Cognition and memory are normal.          Assessment & Plan:  HM  Will set up referral for sceening colonoscopy  See scanned sheet  Pap in 2-3 years  Quit smoking over 15 years ago.  Extreme breast density;  Low risk by FH  Advised 3 D mm next year  Vaginal spotting  Will get pelvic and TVUS.   Will change HT to prometrium 100 mg dialy with estradiol  .075 mg twice a week. Further  management based on results  TSH supression  Repeat testing normal will check today  PVC's palpitaitons  On Inderal  MVP  Spent 50 mins with face to face over 50% in counseling

## 2013-07-31 NOTE — Patient Instructions (Addendum)
Schedule colonoscopy  With Dr. Fuller Plan or Dr. Henrene Pastor    See me as needed  Schedule pelvic  TVUS  At [facility]

## 2013-09-09 ENCOUNTER — Ambulatory Visit (AMBULATORY_SURGERY_CENTER): Payer: Self-pay | Admitting: *Deleted

## 2013-09-09 VITALS — Ht 67.0 in | Wt 121.6 lb

## 2013-09-09 DIAGNOSIS — Z1211 Encounter for screening for malignant neoplasm of colon: Secondary | ICD-10-CM

## 2013-09-09 MED ORDER — MOVIPREP 100 G PO SOLR: ORAL | Status: DC

## 2013-09-09 NOTE — Progress Notes (Signed)
Patient denies any allergies to eggs or soy. Patient denies any problems with anesthesia/sedation. Patient does states post-op she had LOW BP. Patient denies any oxygen use at home and does not take any diet/weight loss medications. EMMI education assisgned to patient on colonoscopy, this was explained and instructions given to patient.

## 2013-09-16 ENCOUNTER — Encounter: Payer: Self-pay | Admitting: Internal Medicine

## 2013-09-16 DIAGNOSIS — M8580 Other specified disorders of bone density and structure, unspecified site: Secondary | ICD-10-CM | POA: Insufficient documentation

## 2013-09-20 ENCOUNTER — Encounter: Payer: Self-pay | Admitting: Internal Medicine

## 2013-09-23 ENCOUNTER — Ambulatory Visit (AMBULATORY_SURGERY_CENTER): Payer: 59 | Admitting: Internal Medicine

## 2013-09-23 ENCOUNTER — Encounter: Payer: Self-pay | Admitting: Internal Medicine

## 2013-09-23 VITALS — BP 103/60 | HR 40 | Temp 97.2°F | Resp 35 | Ht 67.0 in | Wt 121.0 lb

## 2013-09-23 DIAGNOSIS — Z1211 Encounter for screening for malignant neoplasm of colon: Secondary | ICD-10-CM

## 2013-09-23 HISTORY — PX: COLONOSCOPY: SHX174

## 2013-09-23 MED ORDER — SODIUM CHLORIDE 0.9 % IV SOLN: 500.0000 mL | INTRAVENOUS | Status: DC

## 2013-09-23 NOTE — Op Note (Signed)
[place]  [address] [place], [address]   COLONOSCOPY PROCEDURE REPORT  PATIENT: Tara James, Tara James  MR#: [id number] BIRTHDATE: [birth year] , 53  yrs. old GENDER: Female ENDOSCOPIST: Jerene Bears, MD REFERRED JO:[id number] Coralyn Mark, M.D. PROCEDURE DATE:  09/23/2013 PROCEDURE:   Colonoscopy, screening First Screening Colonoscopy - Avg.  risk and is 50 yrs.  old or older Yes.  Prior Negative Screening - Now for repeat screening. N/A  History of Adenoma - Now for follow-up colonoscopy & has been > or = to 3 yrs.  N/A  Polyps Removed Today? No.  Recommend repeat exam, <10 yrs? No. ASA CLASS:   Class II INDICATIONS:average risk screening and first colonoscopy. MEDICATIONS: MAC sedation, administered by CRNA and propofol (Diprivan) 300mg  IV  DESCRIPTION OF PROCEDURE:   After the risks benefits and alternatives of the procedure were thoroughly explained, informed consent was obtained.  A digital rectal exam revealed no rectal mass.   The LB [id number] and LB [id number]  endoscope was introduced through the anus and advanced to the cecum, which was identified by both the appendix and ileocecal valve. No adverse events experienced.   The quality of the prep was good, using MoviPrep  The instrument was then slowly withdrawn as the colon was fully examined.   COLON FINDINGS: There was mild scattered diverticulosis noted in the sigmoid colon.   The colon was otherwise normal.  There was no inflammation, polyps or cancers seen.  Retroflexed views revealed no abnormalities. The time to cecum=6 minutes 47 seconds. Withdrawal time=9 minutes 36 seconds.  The scope was withdrawn and the procedure completed. COMPLICATIONS: There were no complications.  ENDOSCOPIC IMPRESSION: 1.   There was mild diverticulosis noted in the sigmoid colon 2.   The colon was otherwise normal  RECOMMENDATIONS: You should continue to follow colorectal cancer screening  guidelines for "routine risk" patients with a repeat colonoscopy in 10 years. There is no need for FOBT (stool) testing for at least 5 years.   eSigned:  Jerene Bears, MD 09/23/2013 9:23 AM   cc: The Patient and Emi Belfast, MD

## 2013-09-23 NOTE — Progress Notes (Signed)
Pt stable to RR 

## 2013-09-23 NOTE — Patient Instructions (Addendum)
YOU HAD AN ENDOSCOPIC PROCEDURE TODAY AT [facility]: Refer to the procedure report that was given to you for any specific questions about what was found during the examination.  If the procedure report does not answer your questions, please call your gastroenterologist to clarify.  If you requested that your care partner not be given the details of your procedure findings, then the procedure report has been included in a sealed envelope for you to review at your convenience later.  YOU SHOULD EXPECT: Some feelings of bloating in the abdomen. Passage of more gas than usual.  Walking can help get rid of the air that was put into your GI tract during the procedure and reduce the bloating. If you had a lower endoscopy (such as a colonoscopy or flexible sigmoidoscopy) you may notice spotting of blood in your stool or on the toilet paper. If you underwent a bowel prep for your procedure, then you may not have a normal bowel movement for a few days.  DIET: Your first meal following the procedure should be a light meal and then it is ok to progress to your normal diet.  A half-sandwich or bowl of soup is an example of a good first meal.  Heavy or fried foods are harder to digest and may make you feel nauseous or bloated.  Likewise meals heavy in dairy and vegetables can cause extra gas to form and this can also increase the bloating.  Drink plenty of fluids but you should avoid alcoholic beverages for 24 hours.  Try to eat a high fiber diet to prevent Diverticulosis.  ACTIVITY: Your care partner should take you home directly after the procedure.  You should plan to take it easy, moving slowly for the rest of the day.  You can resume normal activity the day after the procedure however you should NOT DRIVE or use heavy machinery for 24 hours (because of the sedation medicines used during the test).    SYMPTOMS TO REPORT IMMEDIATELY: A gastroenterologist can be reached at any hour.  During normal  business hours, 8:30 AM to 5:00 PM Monday through Friday, call [phone].  After hours and on weekends, please call the GI answering service at [phone] who will take a message and have the physician on call contact you.   Following lower endoscopy (colonoscopy or flexible sigmoidoscopy):  Excessive amounts of blood in the stool  Significant tenderness or worsening of abdominal pains  Swelling of the abdomen that is new, acute  Fever of 100F or higher  FOLLOW UP: If any biopsies were taken you will be contacted by phone or by letter within the next 1-3 weeks.  Call your gastroenterologist if you have not heard about the biopsies in 3 weeks.  Our staff will call the home number listed on your records the next business day following your procedure to check on you and address any questions or concerns that you may have at that time regarding the information given to you following your procedure. This is a courtesy call and so if there is no answer at the home number and we have not heard from you through the emergency physician on call, we will assume that you have returned to your regular daily activities without incident.  SIGNATURES/CONFIDENTIALITY: You and/or your care partner have signed paperwork which will be entered into your electronic medical record.  These signatures attest to the fact that that the information above on your After Visit Summary has been reviewed  and is understood.  Full responsibility of the confidentiality of this discharge information lies with you and/or your care-partner.  Do share the patient's HR strip with the Cardiologist when you see him/her next time, and go to the ER if you have symptoms.

## 2013-09-23 NOTE — Progress Notes (Signed)
Patient came into recovery with a type of block hr.  Her heart rate went down to 41 and was irregular.   I told Dr. Hilarie Fredrickson, and no orders were received.  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   I gave him a strip of the HR and told him to share that with Cardiology.  He stated that it "wasn't necessary"...that the patient was "fine."  She is asymptomatic at this time. Pink and arousable.  Talking to me.  She states that her heartrate always goes down when she has any type of sedation.

## 2013-09-24 ENCOUNTER — Telehealth: Payer: Self-pay | Admitting: *Deleted

## 2013-09-24 ENCOUNTER — Encounter: Payer: Self-pay | Admitting: Internal Medicine

## 2013-09-24 DIAGNOSIS — K5790 Diverticulosis of intestine, part unspecified, without perforation or abscess without bleeding: Secondary | ICD-10-CM | POA: Insufficient documentation

## 2013-09-24 NOTE — Telephone Encounter (Signed)
  Follow up Call-  Call back number 09/23/2013  Post procedure Call Back phone  # [phone]  Permission to leave phone message Yes     Patient questions:  Do you have a fever, pain , or abdominal swelling? no Pain Score  0 *  Have you tolerated food without any problems? yes  Have you been able to return to your normal activities? yes  Do you have any questions about your discharge instructions: Diet   no Medications  no Follow up visit  no  Do you have questions or concerns about your Care? no  Actions: * If pain score is 4 or above: No action needed, pain <4.

## 2013-10-30 ENCOUNTER — Telehealth: Payer: Self-pay | Admitting: *Deleted

## 2013-10-30 MED ORDER — ESTRADIOL 0.075 MG/24HR TD PTTW: 1.0000 | MEDICATED_PATCH | TRANSDERMAL | Status: DC

## 2013-10-30 NOTE — Telephone Encounter (Signed)
Refilled Estradiol Patch

## 2013-12-09 ENCOUNTER — Other Ambulatory Visit: Payer: Self-pay | Admitting: *Deleted

## 2013-12-09 MED ORDER — PROGESTERONE MICRONIZED 100 MG PO CAPS: 100.0000 mg | ORAL_CAPSULE | Freq: Every day | ORAL | Status: DC

## 2013-12-09 NOTE — Telephone Encounter (Signed)
Received fax requesting refill

## 2013-12-09 NOTE — Telephone Encounter (Signed)
Tara James    Call pt and let her know that I  refillled 30 days of her prometrium only.  I ordered a pelvic and TVUS back in April because she had vaginal bleeding in 05/2013 .  I cannot give her any more hormones until I know the result of her ultrasound  Route back with her response   .

## 2013-12-11 NOTE — Telephone Encounter (Signed)
Called pt & l/m for a return call to give Dr's instructions.

## 2014-01-03 ENCOUNTER — Telehealth: Payer: Self-pay

## 2014-01-03 NOTE — Telephone Encounter (Signed)
[place] [phone] [facility] [place], [place]  Alazne called to request a refill for her ALPRAZolam Duanne Moron) 0.5 MG tablet, she is going out of town on Wednesday and would like to have some to take with her. She would like a call on Monday.

## 2014-01-06 ENCOUNTER — Other Ambulatory Visit: Payer: Self-pay | Admitting: *Deleted

## 2014-01-06 DIAGNOSIS — F419 Anxiety disorder, unspecified: Secondary | ICD-10-CM

## 2014-01-06 DIAGNOSIS — F40243 Fear of flying: Secondary | ICD-10-CM

## 2014-01-06 MED ORDER — ALPRAZOLAM 0.5 MG PO TABS: 0.2500 mg | ORAL_TABLET | Freq: Every evening | ORAL | Status: DC | PRN

## 2014-01-06 NOTE — Telephone Encounter (Signed)
Refill request for Xanax.  

## 2014-01-06 NOTE — Telephone Encounter (Signed)
I called pharmacy and verified that patient did pick up the RX on 01-04-14.-eh

## 2014-01-06 NOTE — Telephone Encounter (Signed)
Margaretha Sheffield  I called this in to pharmacy yesterday.  Just call and clarify with them

## 2014-01-17 ENCOUNTER — Other Ambulatory Visit: Payer: Self-pay | Admitting: *Deleted

## 2014-01-17 MED ORDER — PROGESTERONE MICRONIZED 100 MG PO CAPS: 100.0000 mg | ORAL_CAPSULE | Freq: Every day | ORAL | Status: DC

## 2014-01-17 MED ORDER — ESTRADIOL 0.075 MG/24HR TD PTTW: 1.0000 | MEDICATED_PATCH | TRANSDERMAL | Status: DC

## 2014-02-17 ENCOUNTER — Encounter: Payer: Self-pay | Admitting: Internal Medicine

## 2014-02-17 ENCOUNTER — Ambulatory Visit (INDEPENDENT_AMBULATORY_CARE_PROVIDER_SITE_OTHER): Payer: 59 | Admitting: Internal Medicine

## 2014-02-17 VITALS — BP 104/73 | HR 77 | Temp 98.2°F | Resp 16 | Ht 67.0 in | Wt 120.0 lb

## 2014-02-17 DIAGNOSIS — R922 Inconclusive mammogram: Secondary | ICD-10-CM

## 2014-02-17 DIAGNOSIS — N898 Other specified noninflammatory disorders of vagina: Secondary | ICD-10-CM

## 2014-02-17 DIAGNOSIS — F40243 Fear of flying: Secondary | ICD-10-CM

## 2014-02-17 DIAGNOSIS — N939 Abnormal uterine and vaginal bleeding, unspecified: Secondary | ICD-10-CM | POA: Insufficient documentation

## 2014-02-17 DIAGNOSIS — Z23 Encounter for immunization: Secondary | ICD-10-CM

## 2014-02-17 DIAGNOSIS — F419 Anxiety disorder, unspecified: Secondary | ICD-10-CM

## 2014-02-17 DIAGNOSIS — R9230 Dense breasts, unspecified: Secondary | ICD-10-CM

## 2014-02-17 DIAGNOSIS — Z780 Asymptomatic menopausal state: Secondary | ICD-10-CM

## 2014-02-17 LAB — CBC WITH DIFFERENTIAL/PLATELET
BASOS PCT: 0 % (ref 0–1)
Basophils Absolute: 0 10*3/uL (ref 0.0–0.1)
Eosinophils Absolute: 0.1 10*3/uL (ref 0.0–0.7)
Eosinophils Relative: 3 % (ref 0–5)
HCT: 42.8 % (ref 36.0–46.0)
HEMOGLOBIN: 14.6 g/dL (ref 12.0–15.0)
Lymphocytes Relative: 49 % — ABNORMAL HIGH (ref 12–46)
Lymphs Abs: 2.3 10*3/uL (ref 0.7–4.0)
MCH: 31.1 pg (ref 26.0–34.0)
MCHC: 34.1 g/dL (ref 30.0–36.0)
MCV: 91.1 fL (ref 78.0–100.0)
MONOS PCT: 8 % (ref 3–12)
Monocytes Absolute: 0.4 10*3/uL (ref 0.1–1.0)
NEUTROS ABS: 1.8 10*3/uL (ref 1.7–7.7)
NEUTROS PCT: 40 % — AB (ref 43–77)
Platelets: 237 10*3/uL (ref 150–400)
RBC: 4.7 MIL/uL (ref 3.87–5.11)
RDW: 13.1 % (ref 11.5–15.5)
WBC: 4.6 10*3/uL (ref 4.0–10.5)

## 2014-02-17 LAB — COMPLETE METABOLIC PANEL WITH GFR
ALBUMIN: 4.8 g/dL (ref 3.5–5.2)
ALT: 20 U/L (ref 0–35)
AST: 25 U/L (ref 0–37)
Alkaline Phosphatase: 61 U/L (ref 39–117)
BUN: 11 mg/dL (ref 6–23)
CO2: 31 mEq/L (ref 19–32)
Calcium: 9.5 mg/dL (ref 8.4–10.5)
Chloride: 101 mEq/L (ref 96–112)
Creat: 0.71 mg/dL (ref 0.50–1.10)
GFR, Est African American: >89 mL/min
GFR, Est Non African American: >89 mL/min
Glucose, Bld: 84 mg/dL (ref 70–99)
POTASSIUM: 4.6 meq/L (ref 3.5–5.3)
SODIUM: 138 meq/L (ref 135–145)
Total Bilirubin: 0.5 mg/dL (ref 0.2–1.2)
Total Protein: 7.4 g/dL (ref 6.0–8.3)

## 2014-02-17 LAB — LIPID PANEL
CHOL/HDL RATIO: 3.5 ratio
Cholesterol: 235 mg/dL — ABNORMAL HIGH (ref 0–200)
HDL: 67 mg/dL (ref >39)
LDL Cholesterol: 146 mg/dL — ABNORMAL HIGH (ref 0–99)
Triglycerides: 108 mg/dL (ref <150)
VLDL: 22 mg/dL (ref 0–40)

## 2014-02-17 LAB — TSH: TSH: 2.958 u[IU]/mL (ref 0.350–4.500)

## 2014-02-17 MED ORDER — ALPRAZOLAM 0.5 MG PO TABS: 0.2500 mg | ORAL_TABLET | Freq: Every evening | ORAL | Status: DC | PRN

## 2014-02-17 MED ORDER — ESTRADIOL 0.05 MG/24HR TD PTTW: 1.0000 | MEDICATED_PATCH | TRANSDERMAL | Status: DC

## 2014-02-17 MED ORDER — PROGESTERONE MICRONIZED 100 MG PO CAPS: 100.0000 mg | ORAL_CAPSULE | Freq: Every day | ORAL | Status: DC

## 2014-02-17 NOTE — Progress Notes (Signed)
Subjective:    Patient ID: Tara James, female    DOB: April 02, 1961, 53 y.o.   MRN: [id number]  HPI 06/2013 cardiology phone note Pt will continue with propranolol daily while having this increased amount of palpitations.  Pt is not consuming caffeine and is not under additional stress, she states the palpitations are just annoying.  She declines app at this time and declines using a beta blocker on a daily use.  Pt will call when she would like to be seen or with further questions or concerns.  RR note for colonoscopy procedure Patient came into recovery with a type of block hr. Her heart rate went down to 41 and was irregular. I told Dr. Hilarie Fredrickson, and no orders were received. 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 I gave him a strip of the HR and told him to share that with Cardiology. He stated that it "wasn't necessary"...that the patient was "fine." She is asymptomatic at this time. Pink and arousable. Talking to me. She states that her heartrate always goes down when she has any type of sedation.        My note 07/2013 HM Will set up referral for sceening colonoscopy See scanned sheet Pap in 2-3 years Quit smoking over 15 years ago.  Extreme breast density; Low risk by FH Advised 3 D mm next year  Vaginal spotting Will get pelvic and TVUS. Will change HT to prometrium 100 mg dialy with estradiol .075 mg twice a week. Further management based on results  TSH supression Repeat testing normal will check today  PVC's palpitaitons On Inderal  MVP  Spent 50 mins with face to face over 50% in counseling  Today   Tara James is here for follow up of several issues  POF  She has known POF that was managed by [name] who is now retired.   She has been on vivelle dot .075 and was on intermittant prometrium  .   GYN note reports progesterone withdrawal bleeding on that regimen. I placed her on daily prometrium .  Overall doing well but one episode of spotting last month.  I did  advise and order  Pelvic and TVUS earlier this year but this was not done  Colonoscopy recently done and has epsiode of bradycardia.  She was not on beta blocker at that time.    She has upcoming appt with her cardiologist to discuss this.   She has a rare palpitation (had holter eval 2014) .  She is a caffiene drinker "several cups per day "    TSH normal earlier this year   Allergies  Allergen Reactions  . Penicillins Rash   Past Medical History  Diagnosis Date  . MVP (mitral valve prolapse)   . Syncope and collapse   . Allergy to poison ivy   . Fibroid tumor    Past Surgical History  Procedure Laterality Date  . Myomectomy  2001   History   Social History  . Marital Status: Married    Spouse Name: N/A    Number of Children: N/A  . Years of Education: N/A   Occupational History  . Not on file.   Social History Main Topics  . Smoking status: Former Smoker -- 0.50 packs/day for 4 years    Types: Cigarettes    Quit date: 08/30/1985  . Smokeless tobacco: Never Used  . Alcohol Use: 1.8 oz/week    3 Glasses of wine per week     Comment:  wine  . Drug Use: No  . Sexual Activity: Yes    Partners: Male   Other Topics Concern  . Not on file   Social History Narrative  . No narrative on file   Family History  Problem Relation Age of Onset  . Diabetes Paternal Grandmother   . Heart disease Father   . Cancer Maternal Grandmother     breast  . Colon cancer Neg Hx    Patient Active Problem List   Diagnosis Date Noted  . Diverticulosis 09/24/2013  . Osteopenia  -1.8 spine GYN office Dexa 09/16/2013  . Post-menopausal bleeding  Progesterone withdrawal (See [name] notes) 07/31/2013  . Breast density  extreme 07/31/2013  . Abdominal bruit 12/06/2012  . 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 see 3/14 note 07/28/2012  . Fibroids 07/28/2012  . Menopause 10/06/2011  . Palpitations 10/06/2011  . Premature ventricular contraction 09/23/2010  .  Mitral valve prolapse 09/23/2010   Current Outpatient Prescriptions on File Prior to Visit  Medication Sig Dispense Refill  . ALPRAZolam (XANAX) 0.5 MG tablet Take 0.5 tablets (0.25 mg total) by mouth at bedtime as needed.  10 tablet  0  . CALCIUM PO Take by mouth daily.        . Cholecalciferol (VITAMIN D-3 PO) Take by mouth daily.        Marland Kitchen estradiol (VIVELLE-DOT) 0.075 MG/24HR Place 1 patch onto the skin 2 (two) times a week.  8 patch  0  . progesterone (PROMETRIUM) 100 MG capsule Take 1 capsule (100 mg total) by mouth daily.  30 capsule  0  . propranolol (INDERAL) 10 MG tablet Take 1 tablet (10 mg total) by mouth 4 (four) times daily as needed.  50 tablet  12   No current facility-administered medications on file prior to visit.         Review of Systems See HPI    Objective:   Physical Exam  Physical Exam  Nursing note and vitals reviewed.  Constitutional: She is oriented to person, place, and time. She appears well-developed and well-nourished.  HENT:  Head: Normocephalic and atraumatic.  Cardiovascular: Normal rate and regular rhythm. Exam reveals no gallop and no friction rub.  No murmur heard.  Pulmonary/Chest: Breath sounds normal. She has no wheezes. She has no rales.  Neurological: She is alert and oriented to person, place, and time.  Skin: Skin is warm and dry.  Psychiatric: She has a normal mood and affect. Her behavior is normal.             Assessment & Plan:  POF  Will reduce dose of estradiol to  .05 twice a week with daily prometrium 100 mg  Will give 30 days worth until mm done  Vaginal spotting will get pelvic and TVUS   She has known fibroids  Further management based on results  Bradycardia  Keep upcoming appt with cardiologist   Uterine fibroids   Flu vaccine today  Labs today

## 2014-02-17 NOTE — Patient Instructions (Signed)
To have xray  Lab today

## 2014-02-18 ENCOUNTER — Other Ambulatory Visit: Payer: Self-pay | Admitting: Internal Medicine

## 2014-02-18 DIAGNOSIS — Z1231 Encounter for screening mammogram for malignant neoplasm of breast: Secondary | ICD-10-CM

## 2014-02-18 LAB — VITAMIN D 25 HYDROXY (VIT D DEFICIENCY, FRACTURES): VIT D 25 HYDROXY: 38 ng/mL (ref 30–89)

## 2014-02-23 ENCOUNTER — Other Ambulatory Visit: Payer: Self-pay

## 2014-02-23 MED ORDER — PROPRANOLOL HCL 10 MG PO TABS
10.0000 mg | ORAL_TABLET | Freq: Four times a day (QID) | ORAL | Status: DC | PRN
Start: 2014-02-23 — End: 2015-05-19

## 2014-02-24 ENCOUNTER — Encounter: Payer: Self-pay | Admitting: Internal Medicine

## 2014-03-02 ENCOUNTER — Telehealth: Payer: Self-pay | Admitting: Internal Medicine

## 2014-03-02 NOTE — Telephone Encounter (Signed)
Tara James  Call pt and check to see if she has scheduled her pelvic ultrasound as I do not see it scheduled as yet.  Let her know that I cannot safely re-order her hormone prescription until I know that her uterine lining is not too thickened  Route back with her response.  I do see she has a mm scheduled 11/9

## 2014-03-03 ENCOUNTER — Ambulatory Visit
Admission: RE | Admit: 2014-03-03 | Discharge: 2014-03-03 | Disposition: A | Discharge status: Home / Self Care | Payer: 59 | Source: Ambulatory Visit | Attending: Internal Medicine | Admitting: Internal Medicine

## 2014-03-03 DIAGNOSIS — Z1231 Encounter for screening mammogram for malignant neoplasm of breast: Secondary | ICD-10-CM

## 2014-03-04 NOTE — Telephone Encounter (Signed)
I got her set up for 03/06/14 at [facility]

## 2014-03-06 ENCOUNTER — Ambulatory Visit (HOSPITAL_COMMUNITY)
Admission: RE | Admit: 2014-03-06 | Discharge: 2014-03-06 | Disposition: A | Discharge status: Home / Self Care | Payer: 59 | Source: Ambulatory Visit | Attending: Internal Medicine | Admitting: Internal Medicine

## 2014-03-06 DIAGNOSIS — N939 Abnormal uterine and vaginal bleeding, unspecified: Secondary | ICD-10-CM

## 2014-03-06 DIAGNOSIS — N950 Postmenopausal bleeding: Secondary | ICD-10-CM | POA: Diagnosis not present

## 2014-03-06 DIAGNOSIS — D259 Leiomyoma of uterus, unspecified: Secondary | ICD-10-CM | POA: Insufficient documentation

## 2014-03-06 DIAGNOSIS — Z780 Asymptomatic menopausal state: Secondary | ICD-10-CM

## 2014-03-10 ENCOUNTER — Telehealth: Payer: Self-pay | Admitting: *Deleted

## 2014-03-10 ENCOUNTER — Telehealth: Payer: Self-pay | Admitting: Internal Medicine

## 2014-03-10 DIAGNOSIS — R9389 Abnormal findings on diagnostic imaging of other specified body structures: Secondary | ICD-10-CM

## 2014-03-10 NOTE — Telephone Encounter (Signed)
Spoke with pt and informed of U/S results  Will refer ot GYN for further management

## 2014-03-10 NOTE — Telephone Encounter (Signed)
Tara James has an appointment with Dr. Lynnette Caffey on 03/17/14@ 10:50. She is aware of this appointment- eh

## 2014-03-24 ENCOUNTER — Other Ambulatory Visit: Payer: Self-pay | Admitting: *Deleted

## 2014-03-24 NOTE — Telephone Encounter (Signed)
Margaretha Sheffield    This pt was supposed to see GYN on 11/23.  Call Dr. Wonda Amis office and get office note before I will refill her prometrium

## 2014-03-24 NOTE — Telephone Encounter (Signed)
I got the office notes and placed them on your desk.-eh

## 2014-03-24 NOTE — Telephone Encounter (Addendum)
Tara James [phone]   [name] called to get a refill on her progesterone (PROMETRIUM) 100 MG capsule

## 2014-03-24 NOTE — Telephone Encounter (Signed)
Refill request

## 2014-03-25 MED ORDER — PROGESTERONE MICRONIZED 100 MG PO CAPS: 100.0000 mg | ORAL_CAPSULE | Freq: Every day | ORAL | Status: DC

## 2014-04-07 ENCOUNTER — Encounter: Payer: Self-pay | Admitting: *Deleted

## 2014-04-11 ENCOUNTER — Ambulatory Visit (INDEPENDENT_AMBULATORY_CARE_PROVIDER_SITE_OTHER): Payer: 59 | Admitting: Cardiovascular Disease

## 2014-04-11 ENCOUNTER — Encounter: Payer: Self-pay | Admitting: Cardiovascular Disease

## 2014-04-11 VITALS — BP 94/78 | HR 58 | Ht 67.0 in | Wt 119.8 lb

## 2014-04-11 DIAGNOSIS — R001 Bradycardia, unspecified: Secondary | ICD-10-CM

## 2014-04-11 DIAGNOSIS — I341 Nonrheumatic mitral (valve) prolapse: Secondary | ICD-10-CM

## 2014-04-11 NOTE — Patient Instructions (Signed)
Your physician wants you to follow-up in: 1 year with Dr Acie Fredrickson. (December 2016).  You will receive a reminder letter in the mail two months in advance. If you don't receive a letter, please call our office to schedule the follow-up appointment.

## 2014-04-11 NOTE — Progress Notes (Signed)
Tara James Date of Birth  November 13, 1960       [facility] [address], [place], [place]  [address]   [place], [place]  [address] [phone]     [phone]   Fax  [phone]    Fax [phone]  Problem List:  1. Mitral Valve Proplapse 2. Palpitations 3. Premature ventricular contractions.  History of Present Illness:  Tara James presents today with increased stress , fatigue and palpitations.  She has been increased stress after her father - in -law Tara James) had a stroke.  She also has had lots of stress at work.  She exercises occasionally - has not had any chest pains or syncope with exercise.  December 06, 2012:  Tara James presents today for follow up of her palpitations.  She has worn the 30 day event monitor which shows many PVCs.  She has not been running but has played tennis - she felt "horrible"  Yesterday, she had significant dyspne and fatigue - even doing very mild exercise such as walking the dog.    Oct. 24, 2014:  Tara James is doing better.  She had an abdominal duplex scan after her last visit because of an abd. Bruit - the scan was negative.  Dec. 18, 2015:  Tara James has been having some left sided chest pain. Lasts several seconds, goes away on its own. She is under lots of stress at work. Exercising on occasion - not associated with the CP.     Current Outpatient Prescriptions on File Prior to Visit  Medication Sig Dispense Refill  . ALPRAZolam (XANAX) 0.5 MG tablet Take 0.5 tablets (0.25 mg total) by mouth at bedtime as needed. 12 tablet 1  . CALCIUM PO Take by mouth daily.      . Cholecalciferol (VITAMIN D-3 PO) Take by mouth daily.      Marland Kitchen estradiol (VIVELLE-DOT) 0.05 MG/24HR patch Place 1 patch (0.05 mg total) onto the skin 2 (two) times a week. 8 patch 0  . progesterone (PROMETRIUM) 100 MG capsule Take 1 capsule (100 mg total) by mouth daily. 30 capsule 0  . propranolol (INDERAL) 10 MG  tablet Take 1 tablet (10 mg total) by mouth 4 (four) times daily as needed. 50 tablet 0   No current facility-administered medications on file prior to visit.    Allergies  Allergen Reactions  . Penicillins Rash    Past Medical History  Diagnosis Date  . MVP (mitral valve prolapse)   . Syncope and collapse   . Allergy to poison ivy   . Fibroid tumor     Past Surgical History  Procedure Laterality Date  . Myomectomy  2001    History  Smoking status  . Former Smoker -- 0.50 packs/day for 4 years  . Types: Cigarettes  . Quit date: 08/30/1985  Smokeless tobacco  . Never Used    History  Alcohol Use  . 1.8 oz/week  . 3 Glasses of wine per week    Comment: wine    Family History  Problem Relation Age of Onset  . Diabetes Paternal Grandmother   . Heart disease Father   . Cancer Maternal Grandmother     breast  . Colon cancer Neg Hx     Reviw of Systems:  Reviewed in the HPI.  All other systems are negative.  Physical Exam: Blood pressure 94/78, pulse 58, height 5\' 7"  (1.702 m), weight 119 lb 12.8 oz (  54.341 kg). General: Well developed, well nourished, in no acute distress.  Head: Normocephalic, atraumatic, sclera non-icteric, mucus membranes are moist,   Neck: Supple. Carotids are 2 + without bruits. No JVD  Lungs: Clear bilaterally to auscultation.  Heart: regular rate.  normal  S1 S2. She has a very soft systolic murmur.  Abdomen: Soft, non-tender, non-distended with normal bowel sounds.  Thin, + palpable abdomina aorta.  + soft bruit.  Msk:  Strength and tone are normal  Extremities: No clubbing or cyanosis. No edema.  Distal pedal pulses are 2+ and equal bilaterally.  Neuro: Alert and oriented X 3. Moves all extremities spontaneously.  Psych:  Responds to questions appropriately with a normal affect.  ECG: Dec. 18, 2015:  Sinus brady, at 58.   Assessment / Plan:

## 2014-04-11 NOTE — Assessment & Plan Note (Signed)
She has sinus brady at baseline. She has transient worsening bradycardia during a colonoscopy.  The rhythm was described as irregular but she did not bring the rhythm strip.  I suspect he had [place].  Reassured her

## 2014-04-11 NOTE — Assessment & Plan Note (Signed)
She remains very stable. He has occasional chest twinges which could be due to her mitral prolapse but I suspect that she has some costochondritis.  Have recommended that she take Motrin or Aleve on occasion.  I'll see her again in one year.

## 2014-04-14 ENCOUNTER — Other Ambulatory Visit: Payer: Self-pay

## 2014-04-14 NOTE — Telephone Encounter (Signed)
Toi  [phone] [facility] on [address] called to see if she could get her estradiol (VIVELLE-DOT) 0.05 MG/24HR patch / progesterone (PROMETRIUM) 100 MG capsule, she is completely out of her patches and she has been to Dr Linda Hedges. She stated that Dr Lynnette Caffey was going to contact Dr Coralyn Mark.

## 2014-04-15 ENCOUNTER — Telehealth: Payer: Self-pay | Admitting: Internal Medicine

## 2014-04-15 MED ORDER — ESTRADIOL 0.05 MG/24HR TD PTTW: 1.0000 | MEDICATED_PATCH | TRANSDERMAL | Status: DC

## 2014-04-15 NOTE — Telephone Encounter (Signed)
Spoke with Dr. Lynnette Caffey  Pt did have sonohystogram no focal lesion  Offered D and C pt declined now.  Advised repeat U/S in one year  Dr. Lynnette Caffey feels ok to conitnue HT  Will reorder estradiol

## 2014-04-28 ENCOUNTER — Other Ambulatory Visit: Payer: Self-pay | Admitting: *Deleted

## 2014-04-28 NOTE — Telephone Encounter (Signed)
Refill request

## 2014-04-29 MED ORDER — PROGESTERONE MICRONIZED 100 MG PO CAPS: 100.0000 mg | ORAL_CAPSULE | Freq: Every day | ORAL | Status: DC

## 2014-08-04 ENCOUNTER — Ambulatory Visit (INDEPENDENT_AMBULATORY_CARE_PROVIDER_SITE_OTHER): Payer: 59 | Admitting: Internal Medicine

## 2014-08-04 ENCOUNTER — Encounter: Payer: Self-pay | Admitting: Internal Medicine

## 2014-08-04 VITALS — BP 102/69 | HR 61 | Resp 16 | Ht 66.5 in | Wt 120.0 lb

## 2014-08-04 DIAGNOSIS — Z1151 Encounter for screening for human papillomavirus (HPV): Secondary | ICD-10-CM | POA: Diagnosis not present

## 2014-08-04 DIAGNOSIS — R001 Bradycardia, unspecified: Secondary | ICD-10-CM

## 2014-08-04 DIAGNOSIS — Z124 Encounter for screening for malignant neoplasm of cervix: Secondary | ICD-10-CM

## 2014-08-04 DIAGNOSIS — Z780 Asymptomatic menopausal state: Secondary | ICD-10-CM

## 2014-08-04 DIAGNOSIS — R938 Abnormal findings on diagnostic imaging of other specified body structures: Secondary | ICD-10-CM | POA: Diagnosis not present

## 2014-08-04 DIAGNOSIS — Z0000 Encounter for general adult medical examination without abnormal findings: Secondary | ICD-10-CM

## 2014-08-04 DIAGNOSIS — Z1211 Encounter for screening for malignant neoplasm of colon: Secondary | ICD-10-CM

## 2014-08-04 DIAGNOSIS — I341 Nonrheumatic mitral (valve) prolapse: Secondary | ICD-10-CM | POA: Diagnosis not present

## 2014-08-04 DIAGNOSIS — R9389 Abnormal findings on diagnostic imaging of other specified body structures: Secondary | ICD-10-CM

## 2014-08-04 DIAGNOSIS — F40243 Fear of flying: Secondary | ICD-10-CM

## 2014-08-04 DIAGNOSIS — F419 Anxiety disorder, unspecified: Secondary | ICD-10-CM

## 2014-08-04 LAB — POCT URINALYSIS DIPSTICK
BILIRUBIN UA: NEGATIVE
Blood, UA: NEGATIVE
Glucose, UA: NEGATIVE
Ketones, UA: NEGATIVE
Leukocytes, UA: NEGATIVE
NITRITE UA: NEGATIVE
PROTEIN UA: NEGATIVE
Spec Grav, UA: 1.01
Urobilinogen, UA: NEGATIVE
pH, UA: 6.5

## 2014-08-04 LAB — LIPID PANEL
CHOL/HDL RATIO: 3.3 ratio
CHOLESTEROL: 194 mg/dL (ref 0–200)
HDL: 58 mg/dL (ref >=46)
LDL CALC: 122 mg/dL — AB (ref 0–99)
TRIGLYCERIDES: 71 mg/dL (ref <150)
VLDL: 14 mg/dL (ref 0–40)

## 2014-08-04 LAB — HEMOCCULT GUIAC POC 1CARD (OFFICE)
Card #1 Date: 4112016
Fecal Occult Blood, POC: NEGATIVE

## 2014-08-04 MED ORDER — ESTRADIOL 0.05 MG/24HR TD PTTW: 1.0000 | MEDICATED_PATCH | TRANSDERMAL | Status: DC

## 2014-08-04 MED ORDER — ALPRAZOLAM 0.5 MG PO TABS: 0.2500 mg | ORAL_TABLET | Freq: Every evening | ORAL | Status: DC | PRN

## 2014-08-04 MED ORDER — PROGESTERONE MICRONIZED 100 MG PO CAPS: 100.0000 mg | ORAL_CAPSULE | Freq: Every day | ORAL | Status: DC

## 2014-08-04 NOTE — Progress Notes (Signed)
Subjective:    Patient ID: Tara James, female    DOB: 02/23/1961, 54 y.o.   MRN: [id number]  HPI  04/11/2014 cardiology note Mitral valve prolapse - Thayer Headings, MD at 04/11/2014 10:53 AM     Status: Written Related Problem: Mitral valve prolapse   Expand All Collapse All   She remains very stable. He has occasional chest twinges which could be due to her mitral prolapse but I suspect that she has some costochondritis.  Have recommended that she take Motrin or Aleve on occasion.  I'll see her again in one year.            Bradycardia - Thayer Headings, MD at 04/11/2014 10:55 AM     Status: Written Related Problem: Bradycardia   Expand All Collapse All   She has sinus brady at baseline. She has transient worsening bradycardia during a colonoscopy. The rhythm was described as irregular but she did not bring the rhythm strip.  I suspect he had [place].       01/2014 my note Assessment & Plan:  POF Will reduce dose of estradiol to .05 twice a week with daily prometrium 100 mg Will give 30 days worth until mm done  Vaginal spotting will get pelvic and TVUS She has known fibroids Further management based on results  Bradycardia Keep upcoming appt with cardiologist   Uterine fibroids   Flu vaccine today  Labs today         TODAY  Tara James is here for CPE  HM  See scanned sheet  Mm due in Nov.  Smoked in college only  Less than 5 years   Reports occasional palpitaitons .  She takes propranolol when she is asymptomatic.  She has eliminated caffiene from her diet  PMB  No further bleeding.  She had endometrial bx done by GYn which was negative and reports she had follow up [facility]   Allergies  Allergen Reactions  . Penicillins Rash   Past Medical History  Diagnosis Date  . MVP (mitral valve prolapse)   . Syncope and collapse   . Allergy to poison ivy   . Fibroid tumor    Past Surgical History  Procedure Laterality Date  .  Myomectomy  2001   History   Social History  . Marital Status: Married    Spouse Name: N/A  . Number of Children: N/A  . Years of Education: N/A   Occupational History  . Not on file.   Social History Main Topics  . Smoking status: Former Smoker -- 0.50 packs/day for 4 years    Types: Cigarettes    Quit date: 08/30/1985  . Smokeless tobacco: Never Used  . Alcohol Use: 1.8 oz/week    3 Glasses of wine per week     Comment: wine  . Drug Use: No  . Sexual Activity:    Partners: Male   Other Topics Concern  . Not on file   Social History Narrative   Family History  Problem Relation Age of Onset  . Diabetes Paternal Grandmother   . Heart disease Father   . Cancer Maternal Grandmother     breast  . Colon cancer Neg Hx    Patient Active Problem List   Diagnosis Date Noted  . Bradycardia 04/11/2014  . Vaginal spotting 02/17/2014  . Diverticulosis 09/24/2013  . Osteopenia  -1.8 spine GYN office Dexa 09/16/2013  . Post-menopausal bleeding  Progesterone withdrawal (See [name] notes) 07/31/2013  . Breast  density  extreme 07/31/2013  . Abdominal bruit 12/06/2012  . 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 see 3/14 note 07/28/2012  . Fibroids 07/28/2012  . Menopause 10/06/2011  . Palpitations 10/06/2011  . Premature ventricular contraction 09/23/2010  . Mitral valve prolapse 09/23/2010   Current Outpatient Prescriptions on File Prior to Visit  Medication Sig Dispense Refill  . ALPRAZolam (XANAX) 0.5 MG tablet Take 0.5 tablets (0.25 mg total) by mouth at bedtime as needed. 12 tablet 1  . CALCIUM PO Take by mouth daily.      . Cholecalciferol (VITAMIN D-3 PO) Take by mouth daily.      Marland Kitchen estradiol (VIVELLE-DOT) 0.05 MG/24HR patch Place 1 patch (0.05 mg total) onto the skin 2 (two) times a week. 8 patch 5  . progesterone (PROMETRIUM) 100 MG capsule Take 1 capsule (100 mg total) by mouth daily. 30 capsule 3  . propranolol (INDERAL) 10 MG tablet Take  1 tablet (10 mg total) by mouth 4 (four) times daily as needed. 50 tablet 0   No current facility-administered medications on file prior to visit.      Review of Systems See HPI    Objective:   Physical Exam Physical Exam  Vital signs and nursing note reviewed  Constitutional: She is oriented to person, place, and time. She appears well-developed and well-nourished. She is cooperative.  HENT:  Head: Normocephalic and atraumatic.  Right Ear: Tympanic membrane normal.  Left Ear: Tympanic membrane normal.  Nose: Nose normal.  Mouth/Throat: Oropharynx is clear and moist and mucous membranes are normal. No oropharyngeal exudate or posterior oropharyngeal erythema.  Eyes: Conjunctivae and EOM are normal. Pupils are equal, round, and reactive to light.  Neck: Neck supple. No JVD present. Carotid bruit is not present. No mass and no thyromegaly present.  Cardiovascular: Regular rhythm, normal heart sounds, intact distal pulses and normal pulses.  Exam reveals no gallop and no friction rub.   No murmur heard. Pulses:      Dorsalis pedis pulses are 2+ on the right side, and 2+ on the left side.  Pulmonary/Chest: Breath sounds normal. She has no wheezes. She has no rhonchi. She has no rales. Right breast exhibits no mass, no nipple discharge and no skin change. Left breast exhibits no mass, no nipple discharge and no skin change.  Abdominal: Soft. Bowel sounds are normal. She exhibits no distension and no mass. There is no hepatosplenomegaly. There is no tenderness. There is no CVA tenderness.  Genitourinary: Rectum normal, vagina normal and uterus normal. Rectal exam shows no mass. Guaiac negative stool. No labial fusion. There is no lesion on the right labia. There is no lesion on the left labia. Cervix exhibits no motion tenderness. Right adnexum displays no mass, no tenderness and no fullness. Left adnexum displays no mass, no tenderness and no fullness. No erythema around the vagina.    Musculoskeletal:       No active synovitis to any joint.    Lymphadenopathy:       Right cervical: No superficial cervical adenopathy present.      Left cervical: No superficial cervical adenopathy present.       Right axillary: No pectoral and no lateral adenopathy present.       Left axillary: No pectoral and no lateral adenopathy present.      Right: No inguinal adenopathy present.       Left: No inguinal adenopathy present.  Neurological: She is alert and oriented to person, place, and  time. She has normal strength and normal reflexes. No cranial nerve deficit or sensory deficit. She displays a negative Romberg sign. Coordination and gait normal.  Skin: Skin is warm and dry. No abrasion, no bruising, no ecchymosis and no rash noted. No cyanosis. Nails show no clubbing.  Psychiatric: She has a normal mood and affect. Her speech is normal and behavior is normal.          Assessment & Plan:   Hm   UTD  See scanned sheet   < 5 pack year smoker,   Advised 3D mm 02/2015  POF/ Postmenopausal bleeding  No further bleeding  Work up neg. Ok to continue HT for POF  MVP/Sinus bradycardia/ palpitations.    Managed by cardiology  Pt counseled of palpitations   Situational stress  Ok for prn Xanax  RX given   Pt notified of my departure in May and will be receiving letter with alternative PCP options       Assessment & Plan:

## 2014-08-05 ENCOUNTER — Encounter: Payer: Self-pay | Admitting: *Deleted

## 2014-08-06 LAB — CYTOLOGY - PAP

## 2015-03-16 ENCOUNTER — Other Ambulatory Visit: Payer: Self-pay | Admitting: Internal Medicine

## 2015-03-16 DIAGNOSIS — Z1231 Encounter for screening mammogram for malignant neoplasm of breast: Secondary | ICD-10-CM

## 2015-04-02 ENCOUNTER — Ambulatory Visit (INDEPENDENT_AMBULATORY_CARE_PROVIDER_SITE_OTHER): Payer: 59

## 2015-04-02 ENCOUNTER — Encounter: Payer: Self-pay | Admitting: Podiatry

## 2015-04-02 ENCOUNTER — Ambulatory Visit (INDEPENDENT_AMBULATORY_CARE_PROVIDER_SITE_OTHER): Payer: 59 | Admitting: Podiatry

## 2015-04-02 VITALS — BP 100/64 | HR 54 | Resp 12

## 2015-04-02 DIAGNOSIS — M21619 Bunion of unspecified foot: Secondary | ICD-10-CM

## 2015-04-02 NOTE — Progress Notes (Signed)
   Subjective:    Patient ID: Tara James, female    DOB: 1960-06-14, 54 y.o.   MRN: [id number]  HPI  PT STATED RT FOOT BUNION BEEN PAINFUL FOR 1 YEAR. FOOT IS NOT WORSE BUT AT NIGHT HAVE SHARP PAIN. TRIED NO TREATMENT.  Review of Systems  All other systems reviewed and are negative.      Objective:   Physical Exam        Assessment & Plan:

## 2015-04-03 ENCOUNTER — Telehealth: Payer: Self-pay | Admitting: *Deleted

## 2015-04-03 NOTE — Telephone Encounter (Signed)
Dr. Paulla Dolly wanted me to call and see if you wanted to have surgery on 04/23/2015.  "Sure that will be fine."  He wants you to come in for a consultation next week.

## 2015-04-05 NOTE — Progress Notes (Signed)
Subjective:     Patient ID: Tara James, female   DOB: [birth year], 54 y.o.   MRN: [id number]  HPI patient states I want to get this right bunion fixed as it's been bothering me more and more for the last year. I've tried wider shoes I tried soaks and I've tried anti-inflammatories without relief of symptoms   Review of Systems  All other systems reviewed and are negative.      Objective:   Physical Exam  Constitutional: She is oriented to person, place, and time.  Cardiovascular: Intact distal pulses.   Musculoskeletal: Normal range of motion.  Neurological: She is oriented to person, place, and time.  Skin: Skin is warm.  Nursing note and vitals reviewed.  neurovascular status intact muscle strength adequate range of motion within normal limits with patient noted to have hyperostosis medial aspect first metatarsal head right with redness around the joint and deviation of the hallux against the second toe. Mild deformity on the left but not to the same degree and patient states that it's become increasingly sore over the last year. 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 We reviewed the causes of condition and treatment options and patient has opted for surgical intervention. Austin-type osteotomy we'll do very well with her with screw fixation and she will return for consult and tentatively scheduled for surgery at the end of the month  X-ray report indicates elevation of the 1 two intermetatarsal angle right of approximately 15 with tibial sesamoidal shift position of 5. The left has minimal deformity and I noted on the right that there is bone that has been laid down on the medial side consistent with structural deformity

## 2015-04-10 ENCOUNTER — Encounter: Payer: Self-pay | Admitting: Podiatry

## 2015-04-10 ENCOUNTER — Institutional Professional Consult (permissible substitution) (INDEPENDENT_AMBULATORY_CARE_PROVIDER_SITE_OTHER): Payer: 59 | Admitting: Podiatry

## 2015-04-10 VITALS — BP 107/67 | HR 86 | Resp 12

## 2015-04-10 DIAGNOSIS — M21619 Bunion of unspecified foot: Secondary | ICD-10-CM | POA: Diagnosis not present

## 2015-04-15 ENCOUNTER — Telehealth: Payer: Self-pay | Admitting: *Deleted

## 2015-04-15 NOTE — Telephone Encounter (Addendum)
Pt states she needs a medium short leg Cam Walker for surgery.  Informed pt I would hold in my office for her. 04/16/2015 - Pt picked up medium short cam walker.

## 2015-04-16 ENCOUNTER — Ambulatory Visit
Admission: RE | Admit: 2015-04-16 | Discharge: 2015-04-16 | Disposition: A | Discharge status: Home / Self Care | Payer: 59 | Source: Ambulatory Visit | Attending: Internal Medicine | Admitting: Internal Medicine

## 2015-04-16 DIAGNOSIS — Z1231 Encounter for screening mammogram for malignant neoplasm of breast: Secondary | ICD-10-CM

## 2015-04-23 DIAGNOSIS — M2011 Hallux valgus (acquired), right foot: Secondary | ICD-10-CM | POA: Diagnosis not present

## 2015-04-29 ENCOUNTER — Ambulatory Visit (INDEPENDENT_AMBULATORY_CARE_PROVIDER_SITE_OTHER): Payer: 59

## 2015-04-29 ENCOUNTER — Encounter: Payer: Self-pay | Admitting: Podiatry

## 2015-04-29 ENCOUNTER — Ambulatory Visit (INDEPENDENT_AMBULATORY_CARE_PROVIDER_SITE_OTHER): Payer: 59 | Admitting: Podiatry

## 2015-04-29 VITALS — BP 117/66 | HR 70 | Temp 96.6°F | Resp 16

## 2015-04-29 DIAGNOSIS — M21619 Bunion of unspecified foot: Secondary | ICD-10-CM | POA: Diagnosis not present

## 2015-04-29 DIAGNOSIS — Z9889 Other specified postprocedural states: Secondary | ICD-10-CM

## 2015-04-29 NOTE — Progress Notes (Signed)
Subjective:     Patient ID: Tara James, female   DOB: 01-26-1961, 55 y.o.   MRN: [id number]  HPI patient states I'm doing very well with my right foot with minimal discomfort and swelling and able to walk   Review of Systems     Objective:   Physical Exam Neurovascular status intact muscle strength adequate with well coapted incision site right first metatarsal with hallux in rectus position and good range of motion. Negative Homan sign was noted    Assessment:     Doing well post Austin osteotomy right    Plan:     X-rays reviewed and allow patient to return to boot and surgical shoe usage and advised on continued elevation compression. Reappoint 4 weeks or earlier if needed

## 2015-04-30 ENCOUNTER — Other Ambulatory Visit: Payer: Self-pay

## 2015-05-19 ENCOUNTER — Encounter: Payer: Self-pay | Admitting: Cardiovascular Disease

## 2015-05-19 ENCOUNTER — Ambulatory Visit (INDEPENDENT_AMBULATORY_CARE_PROVIDER_SITE_OTHER): Payer: 59 | Admitting: Cardiovascular Disease

## 2015-05-19 VITALS — BP 102/70 | HR 57 | Ht 66.5 in | Wt 120.8 lb

## 2015-05-19 DIAGNOSIS — R002 Palpitations: Secondary | ICD-10-CM

## 2015-05-19 DIAGNOSIS — I341 Nonrheumatic mitral (valve) prolapse: Secondary | ICD-10-CM | POA: Diagnosis not present

## 2015-05-19 DIAGNOSIS — I059 Rheumatic mitral valve disease, unspecified: Secondary | ICD-10-CM | POA: Diagnosis not present

## 2015-05-19 MED ORDER — PROPRANOLOL HCL 10 MG PO TABS
10.0000 mg | ORAL_TABLET | Freq: Four times a day (QID) | ORAL | Status: DC | PRN
Start: 2015-05-19 — End: 2017-01-16

## 2015-05-19 NOTE — Patient Instructions (Signed)
Medication Instructions:  Your physician recommends that you continue on your current medications as directed. Please refer to the Current Medication list given to you today.   Labwork: None Ordered   Testing/Procedures: None Ordered   Follow-Up: Your physician wants you to follow-up in: 2 years with Dr. Nahser.  You will receive a reminder letter in the mail two months in advance. If you don't receive a letter, please call our office to schedule the follow-up appointment.   If you need a refill on your cardiac medications before your next appointment, please call your pharmacy.   Thank you for choosing [facility]! Michelle Swinyer, RN [phone]    

## 2015-05-19 NOTE — Progress Notes (Signed)
Tara James Date of Birth  1961-01-10       [facility] [address], [address], [place], [place]  [address]   [place], [place]  [address] [phone]     [phone]   Fax  [phone]    Fax [phone]  Problem List:  1. Mitral Valve Proplapse 2. Palpitations 3. Premature ventricular contractions.  History of Present Illness:  Tara James presents today with increased stress , fatigue and palpitations.  She has been increased stress after her father - in -law [name]) had a stroke.  She also has had lots of stress at work.  She exercises occasionally - has not had any chest pains or syncope with exercise.  December 06, 2012:  Tara James presents today for follow up of her palpitations.  She has worn the 30 day event monitor which shows many PVCs.  She has not been running but has played tennis - she felt "horrible"  Yesterday, she had significant dyspne and fatigue - even doing very mild exercise such as walking the dog.    Oct. 24, 2014:  Tara James is doing better.  She had an abdominal duplex scan after her last visit because of an abd. Bruit - the scan was negative.  Dec. 18, 2015:  Tara James has been having some left sided chest pain. Lasts several seconds, goes away on its own. She is under lots of stress at work. Exercising on occasion - not associated with the CP.    Jan. 24, 2017: Doing great.  Was exercising before her right bunion surgery .  Doing well    Current Outpatient Prescriptions on File Prior to Visit  Medication Sig Dispense Refill  . ALPRAZolam (XANAX) 0.5 MG tablet Take 0.5 tablets (0.25 mg total) by mouth at bedtime as needed. 12 tablet 1  . CALCIUM PO Take 1 tablet by mouth daily.     . Cholecalciferol (VITAMIN D-3 PO) Take 1 tablet by mouth daily.     Marland Kitchen estradiol (VIVELLE-DOT) 0.05 MG/24HR patch Place 1 patch (0.05 mg total) onto the skin 2 (two) times a week. 8 patch 5  . progesterone  (PROMETRIUM) 100 MG capsule Take 1 capsule (100 mg total) by mouth daily. 90 capsule 1  . propranolol (INDERAL) 10 MG tablet Take 1 tablet (10 mg total) by mouth 4 (four) times daily as needed. 50 tablet 0   No current facility-administered medications on file prior to visit.    Allergies  Allergen Reactions  . Penicillins Rash    Past Medical History  Diagnosis Date  . MVP (mitral valve prolapse)   . Syncope and collapse   . Allergy to poison ivy   . Fibroid tumor     Past Surgical History  Procedure Laterality Date  . Myomectomy  2001    History  Smoking status  . Former Smoker -- 0.50 packs/day for 4 years  . Types: Cigarettes  . Quit date: 08/30/1985  Smokeless tobacco  . Never Used    History  Alcohol Use  . 1.8 oz/week  . 3 Glasses of wine per week    Comment: wine    Family History  Problem Relation Age of Onset  . Diabetes Paternal Grandmother   . Heart disease Father   . Cancer Maternal Grandmother     breast  . Colon cancer Neg Hx     Reviw of Systems:  Reviewed in the HPI.  All other  systems are negative.  Physical Exam: Blood pressure 102/70, pulse 57, height 5' 6.5" (1.689 m), weight 120 lb 12.8 oz (54.795 kg). General: Well developed, well nourished, in no acute distress.  Head: Normocephalic, atraumatic, sclera non-icteric, mucus membranes are moist,   Neck: Supple. Carotids are 2 + without bruits. No JVD  Lungs: Clear bilaterally to auscultation.  Heart: regular rate.  normal  S1 S2. She has a very soft systolic murmur.  Abdomen: Soft, non-tender, non-distended with normal bowel sounds.  Thin, + palpable abdomina aorta.  + soft bruit.  Msk:  Strength and tone are normal  Extremities: No clubbing or cyanosis. No edema.  Distal pedal pulses are 2+ and equal bilaterally.  Neuro: Alert and oriented X 3. Moves all extremities spontaneously.  Psych:  Responds to questions appropriately with a normal affect.  ECG: Jan. 24, 2017.   Sinus brady  At 57   Assessment / Plan:   1. MVP - associated with trivial MR ,  she's very stable. Her exam has not changed in years. I'll plan on seeing her again in several years.  Her exam is unchanged. I do not think that she needs repeat echocardiogram at this time. Her last echocardiogram  was in 2010. I will see her back in 2-3 years.     Reathel Turi, Wonda Cheng, MD  05/19/2015 5:10 PM    [facility] [place],  [place], [place]  [address] Pager [phone] Phone: [phone]; Fax: [phone]   [facility]  [address] [place], [place]  [address] [phone]   Fax [phone]

## 2015-05-21 ENCOUNTER — Other Ambulatory Visit: Payer: Self-pay

## 2015-05-21 ENCOUNTER — Ambulatory Visit (INDEPENDENT_AMBULATORY_CARE_PROVIDER_SITE_OTHER): Payer: 59

## 2015-05-21 ENCOUNTER — Encounter: Payer: Self-pay | Admitting: Podiatry

## 2015-05-21 ENCOUNTER — Ambulatory Visit (INDEPENDENT_AMBULATORY_CARE_PROVIDER_SITE_OTHER): Payer: 59 | Admitting: Podiatry

## 2015-05-21 DIAGNOSIS — M21619 Bunion of unspecified foot: Secondary | ICD-10-CM

## 2015-05-21 DIAGNOSIS — Z9889 Other specified postprocedural states: Secondary | ICD-10-CM

## 2015-05-21 NOTE — Progress Notes (Signed)
Subjective:     Patient ID: Tara James, female   DOB: Mar 07, 1961, 55 y.o.   MRN: [id number]  HPI patient presents stating I still have some swelling in my big toe joint but overall I'm doing well I'm just having trouble fitting it into a shoe   Review of Systems     Objective:   Physical Exam Neurovascular status intact muscle strength adequate with wound edges well coapted and good alignment of the first MPJ right with good range of motion and no crepitus noted    Assessment:     Doing well post osteotomy first metatarsal right with good alignment and rotation and motion    Plan:     Reviewed condition and x-ray and at this time patient may gradually return soft shoe gear and I instructed on tinea range of motion and utilization of anklet for compression and elevation to be continued

## 2015-06-15 ENCOUNTER — Ambulatory Visit (INDEPENDENT_AMBULATORY_CARE_PROVIDER_SITE_OTHER): Payer: 59 | Admitting: Podiatry

## 2015-06-15 ENCOUNTER — Ambulatory Visit (INDEPENDENT_AMBULATORY_CARE_PROVIDER_SITE_OTHER): Payer: 59

## 2015-06-15 ENCOUNTER — Encounter: Payer: Self-pay | Admitting: Podiatry

## 2015-06-15 VITALS — BP 111/70 | HR 56 | Resp 16

## 2015-06-15 DIAGNOSIS — Z9889 Other specified postprocedural states: Secondary | ICD-10-CM | POA: Diagnosis not present

## 2015-06-17 NOTE — Progress Notes (Signed)
Subjective:     Patient ID: Tara James, female   DOB: 02-Dec-1960, 56 y.o.   MRN: [id number]  HPI patient states I'm doing very well with minimal discomfort   Review of Systems     Objective:   Physical Exam Neurovascular status intact with structural alignment first MPJ good with wound edges well coapted and good range of motion    Assessment:     Doing well post osteotomy first metatarsal right    Plan:     X-rays reviewed and allow patient to return to more aggressive activity but no running for at least 6 weeks. Reappoint as needed  X-ray report indicates osteotomy and good position pins in place joint congruence with no sign of motion

## 2015-10-05 ENCOUNTER — Ambulatory Visit
Admission: RE | Admit: 2015-10-05 | Discharge: 2015-10-05 | Disposition: A | Discharge status: Home / Self Care | Payer: 59 | Source: Ambulatory Visit | Attending: Internal Medicine | Admitting: Internal Medicine

## 2015-10-05 ENCOUNTER — Other Ambulatory Visit: Payer: Self-pay | Admitting: Internal Medicine

## 2015-10-05 DIAGNOSIS — M542 Cervicalgia: Secondary | ICD-10-CM

## 2015-11-19 ENCOUNTER — Encounter: Payer: Self-pay | Admitting: Podiatry

## 2015-11-25 IMAGING — US US TRANSVAGINAL NON-OB
1 series · 13 of 25 positions shown · non-contrast
Comparison: None

CLINICAL DATA: Postmenopausal vaginal spotting; history of
myomectomy and 2 previous vaginal deliveries

EXAM:
TRANSABDOMINAL AND TRANSVAGINAL ULTRASOUND OF PELVIS
TECHNIQUE: Both transabdominal and transvaginal ultrasound examinations of the
pelvis were performed. Transabdominal technique was performed for
global imaging of the pelvis including uterus, ovaries, adnexal
regions, and pelvic cul-de-sac. It was necessary to proceed with
endovaginal exam following the transabdominal exam to visualize the
endometrium and adnexal structures.

[Series 1: us transvaginal non-ob · 0.21mm/px · 13 of 77 slices shown]
[im 1/77]
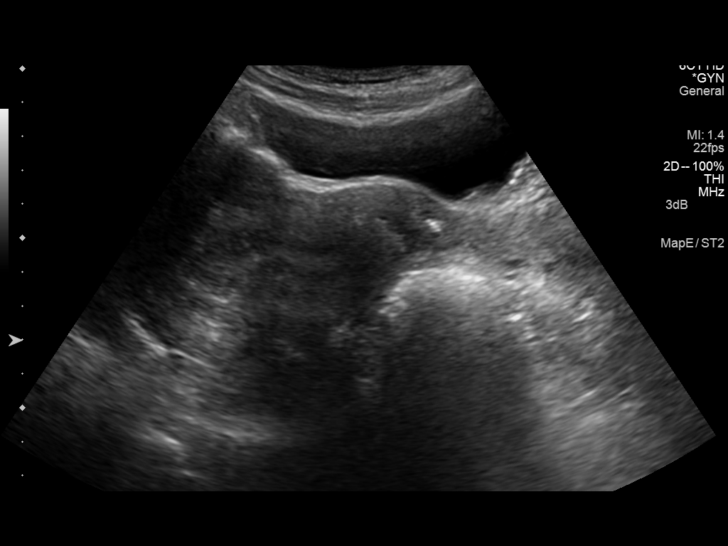
[im 7/77]
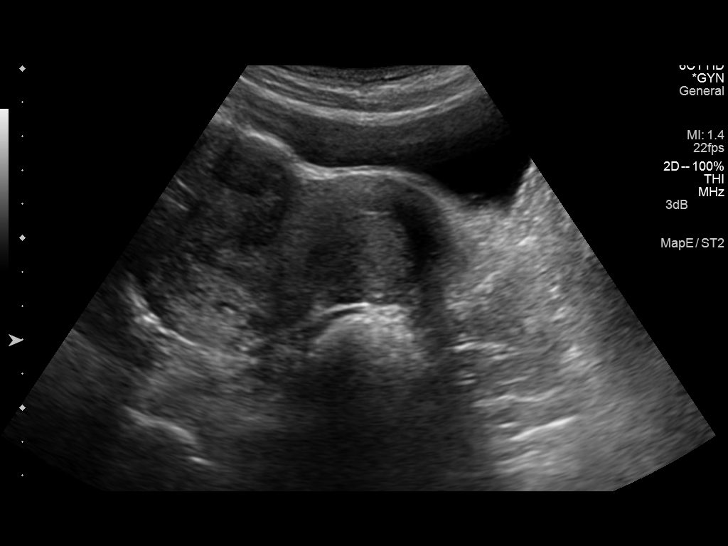
[im 13/77]
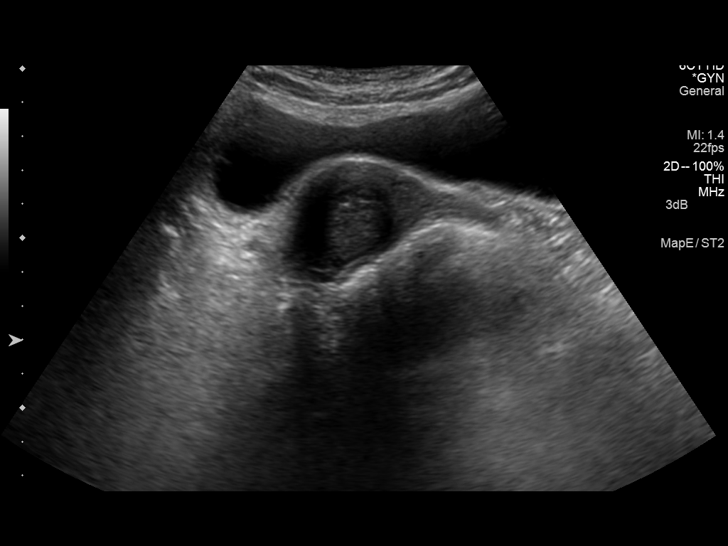
[im 20/77]
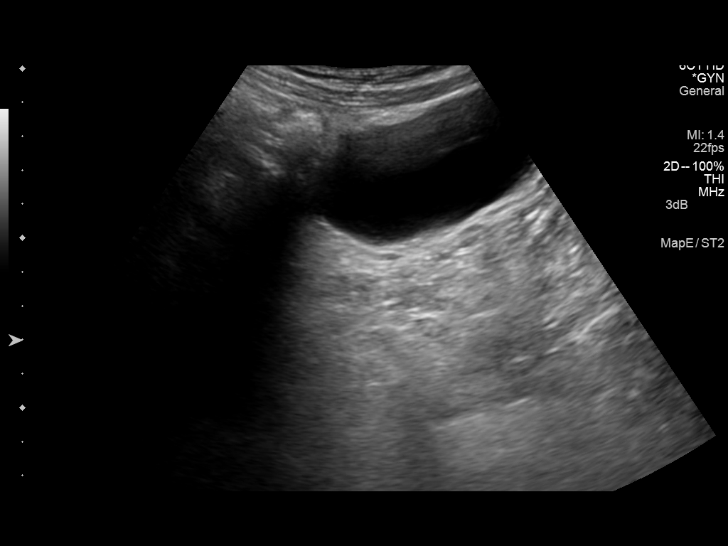
[im 26/77]
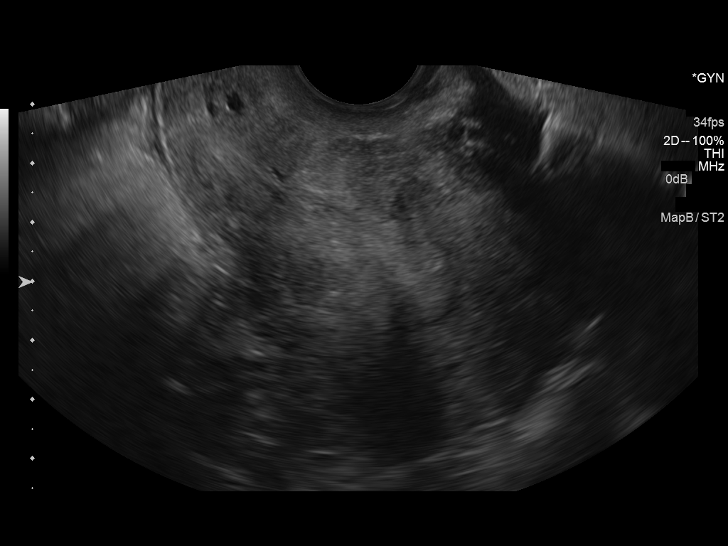
[im 32/77]
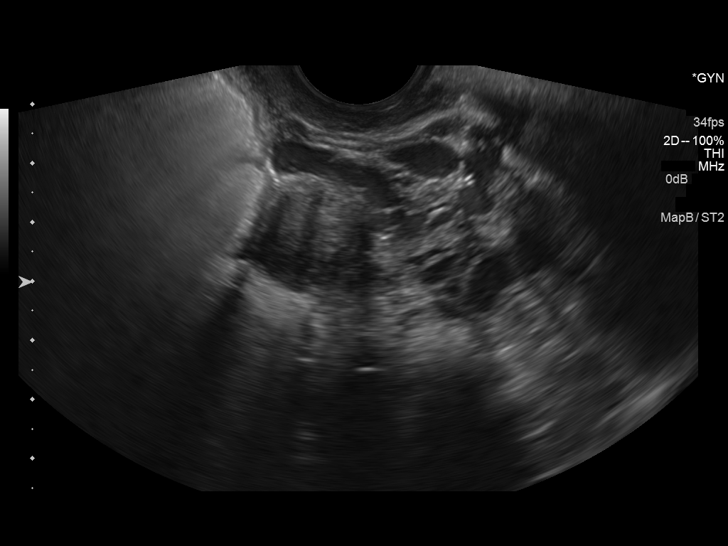
[im 39/77]
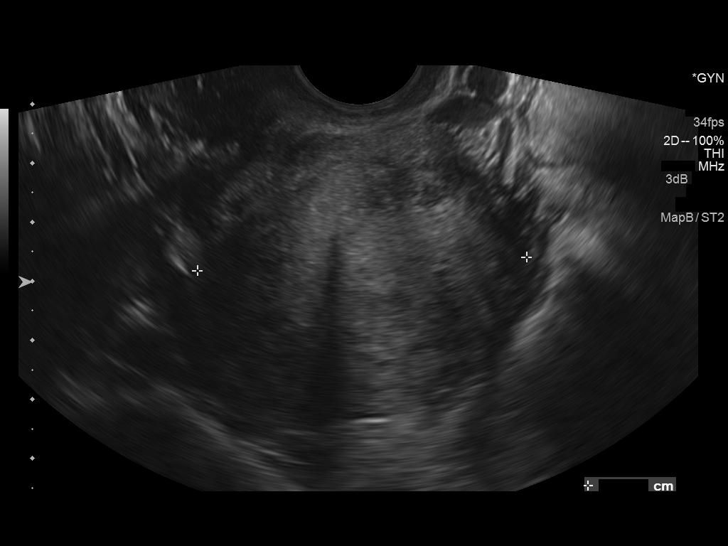
[im 45/77]
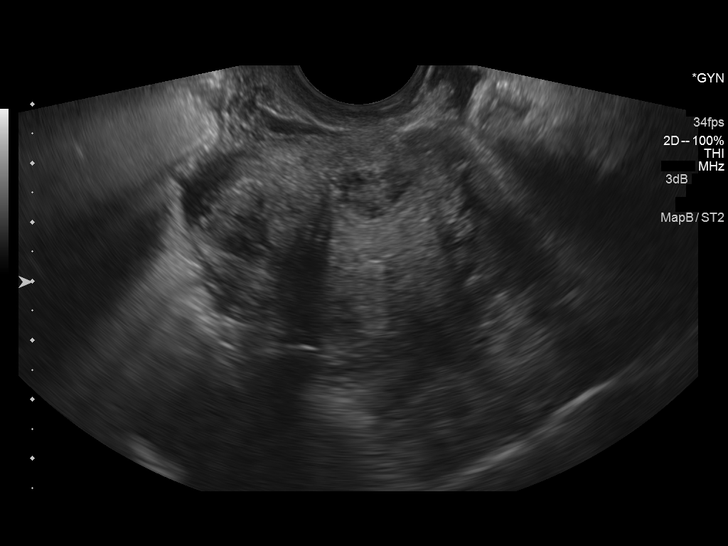
[im 51/77]
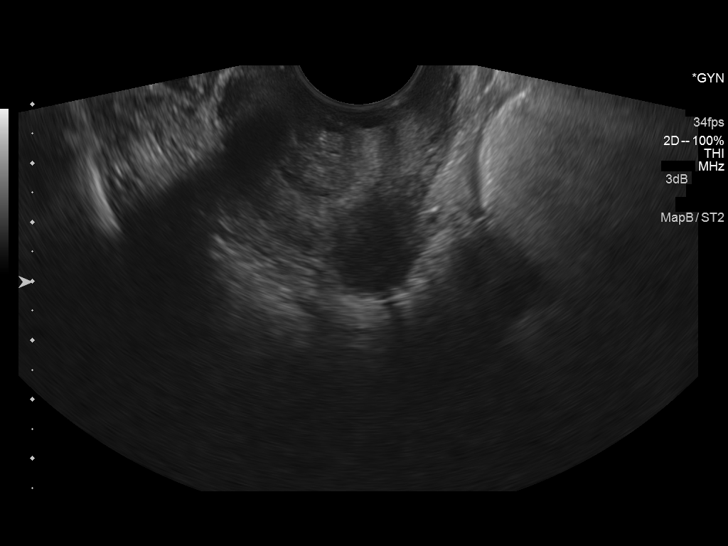
[im 58/77]
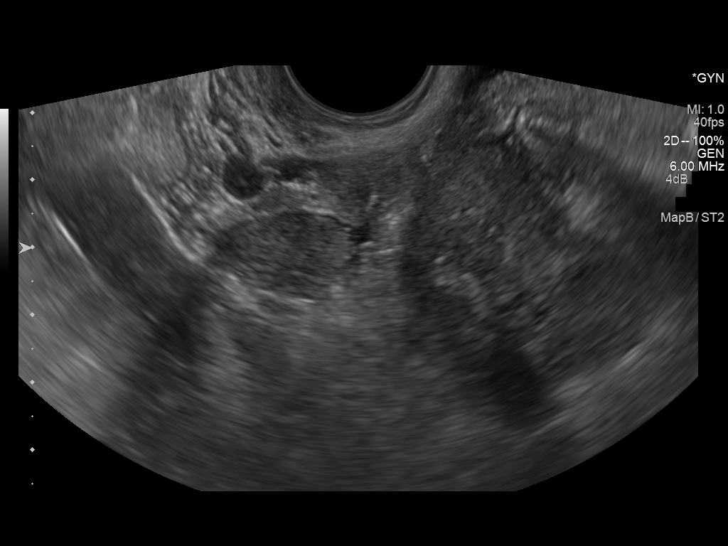
[im 64/77]
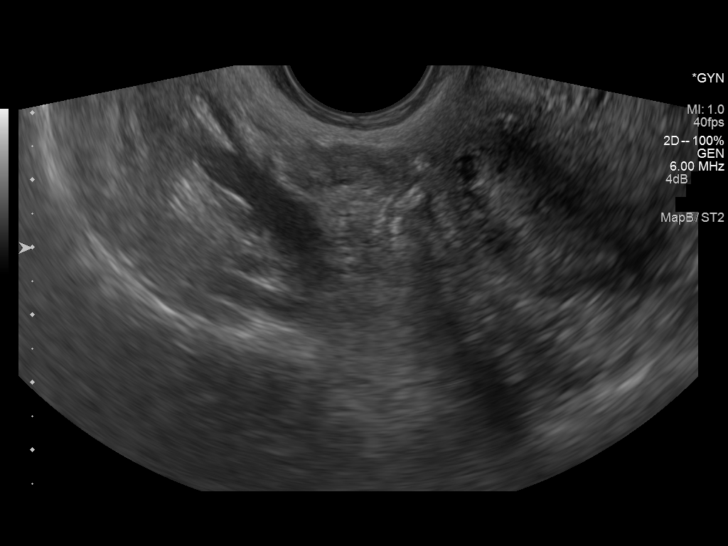
[im 70/77]
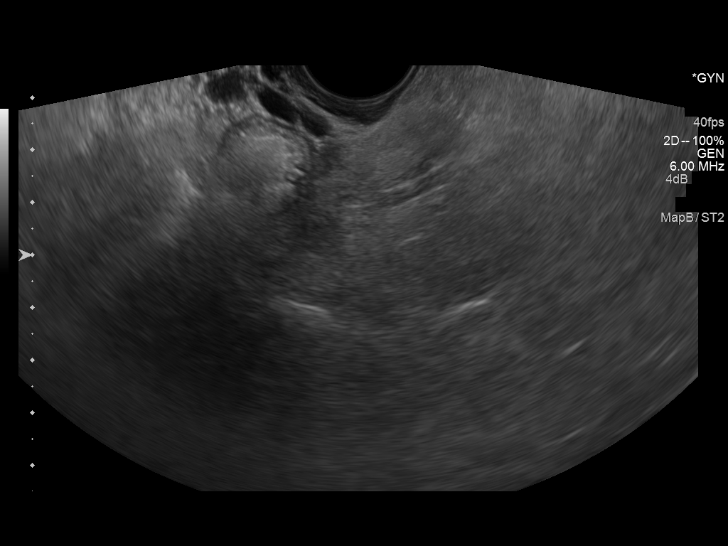
[im 77/77]
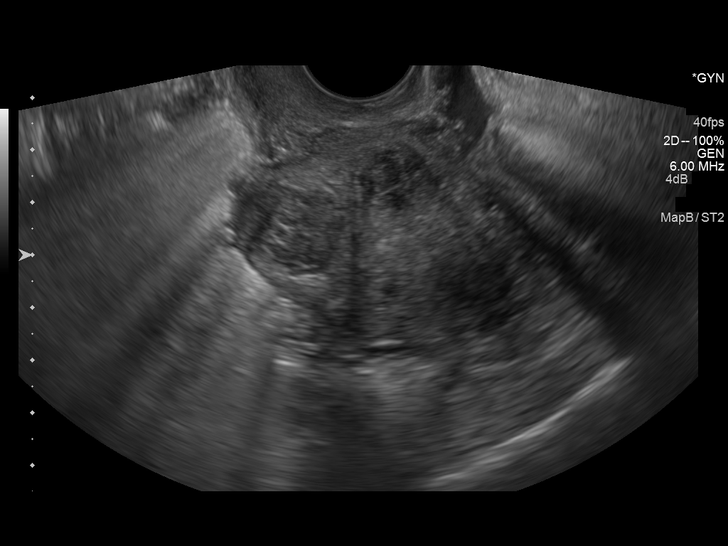

[13 of 25 positions shown; findings below may reference images not displayed]

FINDINGS: Uterus

Measurements: 8.3 x 5.1 x 5.6 cm. The uterine echotexture is
heterogeneous and multiple fibroids are demonstrated. The largest
fibroids measure up to 2.3 cm in diameter..

Endometrium

Thickness: 7.7 mm.  No focal abnormality visualized.

Right ovary

Measurements: 1.4 x 1.1 x 2 cm. Normal appearance/no adnexal mass.

Left ovary

The left ovary was not visualized.

Other findings

There is small amount of free pelvic fluid.
IMPRESSION: 1. There are multiple uterine fibroids with the largest measuring
2.3 cm.
2. The endometrial stripe measures 7.7 mm. In the setting of
post-menopausal bleeding, endometrial sampling is indicated to
exclude carcinoma. If results are benign, sonohysterogram should be
considered for focal lesion work-up. (Ref: Radiological Reasoning:
Algorithmic Workup of Abnormal Vaginal Bleeding with Endovaginal
Sonography and Sonohysterography. AJR 4553; 191:S68-73).
3. The right ovary is normal in appearance. The left ovary could not
be demonstrated. There is a small amount of free fluid in the
pelvis.

## 2015-12-24 NOTE — Progress Notes (Signed)
Surgery consult

## 2016-04-26 DIAGNOSIS — M542 Cervicalgia: Secondary | ICD-10-CM | POA: Diagnosis not present

## 2016-05-04 DIAGNOSIS — M542 Cervicalgia: Secondary | ICD-10-CM | POA: Diagnosis not present

## 2016-05-20 DIAGNOSIS — M542 Cervicalgia: Secondary | ICD-10-CM | POA: Diagnosis not present

## 2016-07-11 ENCOUNTER — Ambulatory Visit (INDEPENDENT_AMBULATORY_CARE_PROVIDER_SITE_OTHER): Payer: 59 | Admitting: Podiatry

## 2016-07-11 ENCOUNTER — Encounter: Payer: Self-pay | Admitting: Podiatry

## 2016-07-11 ENCOUNTER — Ambulatory Visit (INDEPENDENT_AMBULATORY_CARE_PROVIDER_SITE_OTHER): Payer: 59

## 2016-07-11 DIAGNOSIS — S99921A Unspecified injury of right foot, initial encounter: Secondary | ICD-10-CM

## 2016-07-11 DIAGNOSIS — M79671 Pain in right foot: Secondary | ICD-10-CM

## 2016-07-11 NOTE — Progress Notes (Signed)
Subjective:     Patient ID: Tara James, female   DOB: 1960/10/09, 56 y.o.   MRN: [id number]  HPI patient points to the right big toe stating that she traumatized it and it has been sore and she's been trying to not bear weight on it   Review of Systems     Objective:   Physical Exam Neurovascular status intact muscle strength adequate with patient had foot surgery approximately 14 months ago for bunion correction right. The pain at this time is more the interphalangeal joint then at the first MPJ with good range of motion first MPJ and discomfort around the intermetatarsal joint    Assessment:     Trauma with possibility for fracture    Plan:     H&P x-rays reviewed and discussed and today I recommended wearing rigid bottom shoes to protect the joint and it should heal uneventfully and if not we may consider cortisone at one point in future.  X-ray report indicates that there is no signs of fracture with pin in place first metatarsal and joint congruence

## 2016-12-27 ENCOUNTER — Other Ambulatory Visit: Payer: Self-pay | Admitting: Internal Medicine

## 2016-12-27 DIAGNOSIS — Z1231 Encounter for screening mammogram for malignant neoplasm of breast: Secondary | ICD-10-CM

## 2016-12-28 ENCOUNTER — Ambulatory Visit
Admission: RE | Admit: 2016-12-28 | Discharge: 2016-12-28 | Disposition: A | Discharge status: Home / Self Care | Payer: 59 | Source: Ambulatory Visit | Attending: Internal Medicine | Admitting: Internal Medicine

## 2016-12-28 DIAGNOSIS — Z1231 Encounter for screening mammogram for malignant neoplasm of breast: Secondary | ICD-10-CM

## 2016-12-29 ENCOUNTER — Other Ambulatory Visit: Payer: Self-pay | Admitting: Internal Medicine

## 2016-12-29 DIAGNOSIS — R928 Other abnormal and inconclusive findings on diagnostic imaging of breast: Secondary | ICD-10-CM

## 2017-01-03 ENCOUNTER — Ambulatory Visit: Payer: 59

## 2017-01-03 ENCOUNTER — Ambulatory Visit
Admission: RE | Admit: 2017-01-03 | Discharge: 2017-01-03 | Disposition: A | Discharge status: Home / Self Care | Payer: 59 | Source: Ambulatory Visit | Attending: Internal Medicine | Admitting: Internal Medicine

## 2017-01-03 DIAGNOSIS — R928 Other abnormal and inconclusive findings on diagnostic imaging of breast: Secondary | ICD-10-CM

## 2017-01-16 ENCOUNTER — Other Ambulatory Visit: Payer: Self-pay | Admitting: Cardiovascular Disease

## 2017-06-23 ENCOUNTER — Ambulatory Visit (INDEPENDENT_AMBULATORY_CARE_PROVIDER_SITE_OTHER): Payer: 59 | Admitting: Cardiovascular Disease

## 2017-06-23 ENCOUNTER — Encounter: Payer: Self-pay | Admitting: Cardiovascular Disease

## 2017-06-23 VITALS — BP 96/64 | HR 67 | Ht 67.0 in | Wt 119.1 lb

## 2017-06-23 DIAGNOSIS — I341 Nonrheumatic mitral (valve) prolapse: Secondary | ICD-10-CM | POA: Diagnosis not present

## 2017-06-23 NOTE — Progress Notes (Signed)
Tara James Date of Birth  1960/05/21       [name], [address], [place], [place]  [address]   [place], [place]  [address] [phone]     [phone]   Fax  [phone]    Fax [phone]  Problem List:  1. Mitral Valve Proplapse 2. Palpitations 3. Premature ventricular contractions.  History of Present Illness:  Tara James presents today with increased stress , fatigue and palpitations.  She has been increased stress after her father - in -law [name]) had a stroke.  She also has had lots of stress at work.  She exercises occasionally - has not had any chest pains or syncope with exercise.  December 06, 2012:  Tara James presents today for follow up of her palpitations.  She has worn the 30 day event monitor which shows many PVCs.  She has not been running but has played tennis - she felt "horrible"  Yesterday, she had significant dyspne and fatigue - even doing very mild exercise such as walking the dog.    Oct. 24, 2014:  Tara James is doing better.  She had an abdominal duplex scan after her last visit because of an abd. Bruit - the scan was negative.  Dec. 18, 2015:  Tara James has been having some left sided chest pain. Lasts several seconds, goes away on its own. She is under lots of stress at work. Exercising on occasion - not associated with the CP.    Jan. 24, 2017: Doing great.  Was exercising before her right bunion surgery .  Doing well   She retired from the business ( sold in 2013)  Tara James is now with [employer] )  Exercising regularly,   Walks regularly ,   Playing some tennis  Walks her dog  No CP , no dyspnea   Current Outpatient Medications on File Prior to Visit  Medication Sig Dispense Refill  . ALPRAZolam (XANAX) 0.5 MG tablet Take 0.5 tablets (0.25 mg total) by mouth at bedtime as needed. 12 tablet 1  . CALCIUM PO Take 1 tablet by mouth daily.     .  Cholecalciferol (VITAMIN D-3 PO) Take 1 tablet by mouth daily.     Marland Kitchen estradiol (VIVELLE-DOT) 0.05 MG/24HR patch Place 1 patch (0.05 mg total) onto the skin 2 (two) times a week. 8 patch 5  . progesterone (PROMETRIUM) 100 MG capsule Take 1 capsule (100 mg total) by mouth daily. 90 capsule 1  . propranolol (INDERAL) 10 MG tablet take 1 tablet by mouth four times a day if needed 30 tablet 0  . propranolol (INDERAL) 10 MG tablet Take 10 mg by mouth 4 (four) times daily as needed (PALPATAITIONS).     No current facility-administered medications on file prior to visit.     Allergies  Allergen Reactions  . Penicillins Rash    Past Medical History:  Diagnosis Date  . Allergy to poison ivy   . Fibroid tumor   . MVP (mitral valve prolapse)   . Syncope and collapse     Past Surgical History:  Procedure Laterality Date  . MYOMECTOMY  2001    Social History   Tobacco Use  Smoking Status Former Smoker  . Packs/day: 0.50  . Years: 4.00  . Pack years: 2.00  . Types: Cigarettes  . Last attempt to quit: 08/30/1985  . Years since quitting: 31.8  Smokeless Tobacco Never  Used    Social History   Substance and Sexual Activity  Alcohol Use Yes  . Alcohol/week: 1.8 oz  . Types: 3 Glasses of wine per week   Comment: wine    Family History  Problem Relation Age of Onset  . Heart disease Father   . Diabetes Paternal Grandmother   . Cancer Maternal Grandmother        breast  . Colon cancer Neg Hx   . Breast cancer Neg Hx     Reviw of Systems:  Reviewed in the HPI.  All other systems are negative.  Physical Exam: Blood pressure 96/64, pulse 67, height 5\' 7"  (1.702 m), weight 119 lb 1.9 oz (54 kg).  GEN:  Well nourished, well developed in no acute distress HEENT: Normal NECK: No JVD; No carotid bruits LYMPHATICS: No lymphadenopathy CARDIAC: RR, very soft systolic murmur , rubs, gallops RESPIRATORY:  Clear to auscultation without rales, wheezing or rhonchi  ABDOMEN: Soft,  non-tender, non-distended MUSCULOSKELETAL:  No edema; No deformity  SKIN: Warm and dry NEUROLOGIC:  Alert and oriented x 3   ECG: June 23, 2017: Normal sinus rhythm at 67 beats a minute.  Right axis deviation.  No ST or T wave changes.   Assessment / Plan:   1. MVP - associated with trivial MR ,   .  Its been 9 years since her last echo. Will get an echo next year before her next office visit     [name], MD  06/23/2017 10:57 AM    [facility] [place],  [place], [place]  [address] Pager [phone] Phone: [phone]; Fax: [phone]   [facility]  [address] [place], [place]  [address] [phone]   Fax [phone]

## 2017-06-23 NOTE — Patient Instructions (Signed)
Medication Instructions:  Your physician recommends that you continue on your current medications as directed. Please refer to the Current Medication list given to you today.   Labwork: None Ordered   Testing/Procedures: Your physician has requested that you have an echocardiogram in 1 year. Echocardiography is a painless test that uses sound waves to create images of your heart. It provides your doctor with information about the size and shape of your heart and how well your heart's chambers and valves are working. This procedure takes approximately one hour. There are no restrictions for this procedure.   Follow-Up: Your physician wants you to follow-up in: 1 year with Dr. Acie Fredrickson after your echo. You will receive a reminder letter in the mail two months in advance. If you don't receive a letter, please call our office to schedule the follow-up appointment.   If you need a refill on your cardiac medications before your next appointment, please call your pharmacy.   Thank you for choosing [facility]! Christen Bame, RN [phone]

## 2017-07-19 DIAGNOSIS — Z7189 Other specified counseling: Secondary | ICD-10-CM | POA: Diagnosis not present

## 2017-08-15 DIAGNOSIS — M8588 Other specified disorders of bone density and structure, other site: Secondary | ICD-10-CM | POA: Diagnosis not present

## 2017-10-17 ENCOUNTER — Ambulatory Visit
Admission: RE | Admit: 2017-10-17 | Discharge: 2017-10-17 | Disposition: A | Discharge status: Home / Self Care | Payer: 59 | Source: Ambulatory Visit | Attending: Internal Medicine | Admitting: Internal Medicine

## 2017-10-17 ENCOUNTER — Other Ambulatory Visit: Payer: Self-pay | Admitting: Internal Medicine

## 2017-10-17 DIAGNOSIS — M545 Low back pain, unspecified: Secondary | ICD-10-CM

## 2017-10-27 DIAGNOSIS — M6281 Muscle weakness (generalized): Secondary | ICD-10-CM | POA: Diagnosis not present

## 2017-10-27 DIAGNOSIS — M545 Low back pain: Secondary | ICD-10-CM | POA: Diagnosis not present

## 2017-10-27 DIAGNOSIS — M5432 Sciatica, left side: Secondary | ICD-10-CM | POA: Diagnosis not present

## 2017-10-31 DIAGNOSIS — M545 Low back pain: Secondary | ICD-10-CM | POA: Diagnosis not present

## 2017-10-31 DIAGNOSIS — M6281 Muscle weakness (generalized): Secondary | ICD-10-CM | POA: Diagnosis not present

## 2017-10-31 DIAGNOSIS — M5432 Sciatica, left side: Secondary | ICD-10-CM | POA: Diagnosis not present

## 2017-11-03 DIAGNOSIS — M545 Low back pain: Secondary | ICD-10-CM | POA: Diagnosis not present

## 2017-11-03 DIAGNOSIS — M6281 Muscle weakness (generalized): Secondary | ICD-10-CM | POA: Diagnosis not present

## 2017-11-03 DIAGNOSIS — M5432 Sciatica, left side: Secondary | ICD-10-CM | POA: Diagnosis not present

## 2017-11-06 DIAGNOSIS — M6281 Muscle weakness (generalized): Secondary | ICD-10-CM | POA: Diagnosis not present

## 2017-11-06 DIAGNOSIS — M5432 Sciatica, left side: Secondary | ICD-10-CM | POA: Diagnosis not present

## 2017-11-06 DIAGNOSIS — M545 Low back pain: Secondary | ICD-10-CM | POA: Diagnosis not present

## 2017-11-08 DIAGNOSIS — M545 Low back pain: Secondary | ICD-10-CM | POA: Diagnosis not present

## 2017-11-08 DIAGNOSIS — M6281 Muscle weakness (generalized): Secondary | ICD-10-CM | POA: Diagnosis not present

## 2017-11-08 DIAGNOSIS — M5432 Sciatica, left side: Secondary | ICD-10-CM | POA: Diagnosis not present

## 2017-11-14 DIAGNOSIS — M6281 Muscle weakness (generalized): Secondary | ICD-10-CM | POA: Diagnosis not present

## 2017-11-14 DIAGNOSIS — M5432 Sciatica, left side: Secondary | ICD-10-CM | POA: Diagnosis not present

## 2017-11-14 DIAGNOSIS — M545 Low back pain: Secondary | ICD-10-CM | POA: Diagnosis not present

## 2017-11-17 DIAGNOSIS — M5432 Sciatica, left side: Secondary | ICD-10-CM | POA: Diagnosis not present

## 2017-11-17 DIAGNOSIS — M545 Low back pain: Secondary | ICD-10-CM | POA: Diagnosis not present

## 2017-11-17 DIAGNOSIS — M6281 Muscle weakness (generalized): Secondary | ICD-10-CM | POA: Diagnosis not present

## 2017-11-21 DIAGNOSIS — M545 Low back pain: Secondary | ICD-10-CM | POA: Diagnosis not present

## 2017-11-21 DIAGNOSIS — M5432 Sciatica, left side: Secondary | ICD-10-CM | POA: Diagnosis not present

## 2017-11-21 DIAGNOSIS — M6281 Muscle weakness (generalized): Secondary | ICD-10-CM | POA: Diagnosis not present

## 2017-11-24 DIAGNOSIS — M545 Low back pain: Secondary | ICD-10-CM | POA: Diagnosis not present

## 2017-11-24 DIAGNOSIS — M5432 Sciatica, left side: Secondary | ICD-10-CM | POA: Diagnosis not present

## 2017-11-24 DIAGNOSIS — M6281 Muscle weakness (generalized): Secondary | ICD-10-CM | POA: Diagnosis not present

## 2017-12-01 DIAGNOSIS — M6281 Muscle weakness (generalized): Secondary | ICD-10-CM | POA: Diagnosis not present

## 2017-12-01 DIAGNOSIS — M5432 Sciatica, left side: Secondary | ICD-10-CM | POA: Diagnosis not present

## 2017-12-01 DIAGNOSIS — M545 Low back pain: Secondary | ICD-10-CM | POA: Diagnosis not present

## 2017-12-04 DIAGNOSIS — M5432 Sciatica, left side: Secondary | ICD-10-CM | POA: Diagnosis not present

## 2017-12-04 DIAGNOSIS — M545 Low back pain: Secondary | ICD-10-CM | POA: Diagnosis not present

## 2017-12-04 DIAGNOSIS — M6281 Muscle weakness (generalized): Secondary | ICD-10-CM | POA: Diagnosis not present

## 2017-12-06 DIAGNOSIS — M6281 Muscle weakness (generalized): Secondary | ICD-10-CM | POA: Diagnosis not present

## 2017-12-06 DIAGNOSIS — M545 Low back pain: Secondary | ICD-10-CM | POA: Diagnosis not present

## 2017-12-06 DIAGNOSIS — M5432 Sciatica, left side: Secondary | ICD-10-CM | POA: Diagnosis not present

## 2017-12-13 DIAGNOSIS — M5432 Sciatica, left side: Secondary | ICD-10-CM | POA: Diagnosis not present

## 2017-12-13 DIAGNOSIS — M6281 Muscle weakness (generalized): Secondary | ICD-10-CM | POA: Diagnosis not present

## 2017-12-13 DIAGNOSIS — M545 Low back pain: Secondary | ICD-10-CM | POA: Diagnosis not present

## 2017-12-15 DIAGNOSIS — M5432 Sciatica, left side: Secondary | ICD-10-CM | POA: Diagnosis not present

## 2017-12-15 DIAGNOSIS — M6281 Muscle weakness (generalized): Secondary | ICD-10-CM | POA: Diagnosis not present

## 2017-12-15 DIAGNOSIS — M545 Low back pain: Secondary | ICD-10-CM | POA: Diagnosis not present

## 2017-12-19 DIAGNOSIS — M545 Low back pain: Secondary | ICD-10-CM | POA: Diagnosis not present

## 2017-12-19 DIAGNOSIS — M5432 Sciatica, left side: Secondary | ICD-10-CM | POA: Diagnosis not present

## 2017-12-19 DIAGNOSIS — M6281 Muscle weakness (generalized): Secondary | ICD-10-CM | POA: Diagnosis not present

## 2017-12-21 DIAGNOSIS — M545 Low back pain: Secondary | ICD-10-CM | POA: Diagnosis not present

## 2017-12-21 DIAGNOSIS — M6281 Muscle weakness (generalized): Secondary | ICD-10-CM | POA: Diagnosis not present

## 2017-12-21 DIAGNOSIS — M5432 Sciatica, left side: Secondary | ICD-10-CM | POA: Diagnosis not present

## 2017-12-26 DIAGNOSIS — M545 Low back pain: Secondary | ICD-10-CM | POA: Diagnosis not present

## 2017-12-26 DIAGNOSIS — M6281 Muscle weakness (generalized): Secondary | ICD-10-CM | POA: Diagnosis not present

## 2017-12-26 DIAGNOSIS — M5432 Sciatica, left side: Secondary | ICD-10-CM | POA: Diagnosis not present

## 2017-12-28 DIAGNOSIS — M5432 Sciatica, left side: Secondary | ICD-10-CM | POA: Diagnosis not present

## 2017-12-28 DIAGNOSIS — M6281 Muscle weakness (generalized): Secondary | ICD-10-CM | POA: Diagnosis not present

## 2017-12-28 DIAGNOSIS — M545 Low back pain: Secondary | ICD-10-CM | POA: Diagnosis not present

## 2018-01-01 DIAGNOSIS — M5432 Sciatica, left side: Secondary | ICD-10-CM | POA: Diagnosis not present

## 2018-01-01 DIAGNOSIS — M6281 Muscle weakness (generalized): Secondary | ICD-10-CM | POA: Diagnosis not present

## 2018-01-01 DIAGNOSIS — M545 Low back pain: Secondary | ICD-10-CM | POA: Diagnosis not present

## 2018-01-10 ENCOUNTER — Other Ambulatory Visit (HOSPITAL_COMMUNITY)
Admission: RE | Admit: 2018-01-10 | Discharge: 2018-01-10 | Disposition: A | Discharge status: Home / Self Care | Payer: 59 | Source: Ambulatory Visit | Attending: Internal Medicine | Admitting: Internal Medicine

## 2018-01-10 DIAGNOSIS — Z01411 Encounter for gynecological examination (general) (routine) with abnormal findings: Secondary | ICD-10-CM | POA: Diagnosis not present

## 2018-01-11 ENCOUNTER — Other Ambulatory Visit: Payer: Self-pay | Admitting: Internal Medicine

## 2018-01-11 DIAGNOSIS — Z0000 Encounter for general adult medical examination without abnormal findings: Secondary | ICD-10-CM | POA: Diagnosis not present

## 2018-01-11 DIAGNOSIS — Z23 Encounter for immunization: Secondary | ICD-10-CM | POA: Diagnosis not present

## 2018-01-12 DIAGNOSIS — I341 Nonrheumatic mitral (valve) prolapse: Secondary | ICD-10-CM | POA: Diagnosis not present

## 2018-01-12 DIAGNOSIS — Z0000 Encounter for general adult medical examination without abnormal findings: Secondary | ICD-10-CM | POA: Diagnosis not present

## 2018-01-12 DIAGNOSIS — Z136 Encounter for screening for cardiovascular disorders: Secondary | ICD-10-CM | POA: Diagnosis not present

## 2018-01-16 LAB — CYTOLOGY - PAP
Adequacy: ABSENT
Diagnosis: NEGATIVE
HPV: NOT DETECTED

## 2018-01-17 ENCOUNTER — Other Ambulatory Visit: Payer: Self-pay | Admitting: Internal Medicine

## 2018-01-17 DIAGNOSIS — Z1231 Encounter for screening mammogram for malignant neoplasm of breast: Secondary | ICD-10-CM

## 2018-01-30 DIAGNOSIS — M545 Low back pain: Secondary | ICD-10-CM | POA: Diagnosis not present

## 2018-02-01 DIAGNOSIS — M545 Low back pain: Secondary | ICD-10-CM | POA: Diagnosis not present

## 2018-02-14 ENCOUNTER — Ambulatory Visit
Admission: RE | Admit: 2018-02-14 | Discharge: 2018-02-14 | Disposition: A | Discharge status: Home / Self Care | Payer: 59 | Source: Ambulatory Visit | Attending: Internal Medicine | Admitting: Internal Medicine

## 2018-02-14 DIAGNOSIS — Z1231 Encounter for screening mammogram for malignant neoplasm of breast: Secondary | ICD-10-CM | POA: Diagnosis not present

## 2018-03-06 DIAGNOSIS — M48061 Spinal stenosis, lumbar region without neurogenic claudication: Secondary | ICD-10-CM | POA: Diagnosis not present

## 2018-03-06 DIAGNOSIS — M5126 Other intervertebral disc displacement, lumbar region: Secondary | ICD-10-CM | POA: Diagnosis not present

## 2018-03-06 DIAGNOSIS — M545 Low back pain: Secondary | ICD-10-CM | POA: Diagnosis not present

## 2018-03-20 DIAGNOSIS — M545 Low back pain: Secondary | ICD-10-CM | POA: Diagnosis not present

## 2018-03-20 DIAGNOSIS — M5126 Other intervertebral disc displacement, lumbar region: Secondary | ICD-10-CM | POA: Diagnosis not present

## 2018-03-20 DIAGNOSIS — M48061 Spinal stenosis, lumbar region without neurogenic claudication: Secondary | ICD-10-CM | POA: Diagnosis not present

## 2018-04-03 DIAGNOSIS — M545 Low back pain: Secondary | ICD-10-CM | POA: Diagnosis not present

## 2018-04-03 DIAGNOSIS — M48061 Spinal stenosis, lumbar region without neurogenic claudication: Secondary | ICD-10-CM | POA: Diagnosis not present

## 2018-04-03 DIAGNOSIS — M5126 Other intervertebral disc displacement, lumbar region: Secondary | ICD-10-CM | POA: Diagnosis not present

## 2018-04-13 DIAGNOSIS — M545 Low back pain: Secondary | ICD-10-CM | POA: Diagnosis not present

## 2018-04-13 DIAGNOSIS — M5126 Other intervertebral disc displacement, lumbar region: Secondary | ICD-10-CM | POA: Diagnosis not present

## 2018-04-13 DIAGNOSIS — M48061 Spinal stenosis, lumbar region without neurogenic claudication: Secondary | ICD-10-CM | POA: Diagnosis not present

## 2018-04-30 DIAGNOSIS — M47816 Spondylosis without myelopathy or radiculopathy, lumbar region: Secondary | ICD-10-CM | POA: Diagnosis not present

## 2018-04-30 DIAGNOSIS — M48061 Spinal stenosis, lumbar region without neurogenic claudication: Secondary | ICD-10-CM | POA: Diagnosis not present

## 2018-04-30 DIAGNOSIS — M545 Low back pain: Secondary | ICD-10-CM | POA: Diagnosis not present

## 2018-06-25 ENCOUNTER — Other Ambulatory Visit (HOSPITAL_COMMUNITY): Payer: 59

## 2018-06-26 ENCOUNTER — Ambulatory Visit (HOSPITAL_COMMUNITY): Payer: BLUE CROSS/BLUE SHIELD | Attending: Cardiovascular Disease

## 2018-06-26 ENCOUNTER — Encounter (INDEPENDENT_AMBULATORY_CARE_PROVIDER_SITE_OTHER): Payer: Self-pay

## 2018-06-26 DIAGNOSIS — I341 Nonrheumatic mitral (valve) prolapse: Secondary | ICD-10-CM | POA: Insufficient documentation

## 2019-02-27 ENCOUNTER — Other Ambulatory Visit: Payer: Self-pay | Admitting: Family Medicine

## 2019-02-27 DIAGNOSIS — Z1231 Encounter for screening mammogram for malignant neoplasm of breast: Secondary | ICD-10-CM

## 2019-03-04 ENCOUNTER — Other Ambulatory Visit: Payer: Self-pay

## 2019-03-04 ENCOUNTER — Ambulatory Visit
Admission: RE | Admit: 2019-03-04 | Discharge: 2019-03-04 | Disposition: A | Discharge status: Home / Self Care | Payer: BC Managed Care – PPO | Source: Ambulatory Visit | Attending: Family Medicine | Admitting: Family Medicine

## 2019-03-04 DIAGNOSIS — Z0000 Encounter for general adult medical examination without abnormal findings: Secondary | ICD-10-CM | POA: Diagnosis not present

## 2019-03-04 DIAGNOSIS — Z79890 Hormone replacement therapy (postmenopausal): Secondary | ICD-10-CM | POA: Diagnosis not present

## 2019-03-04 DIAGNOSIS — Z1231 Encounter for screening mammogram for malignant neoplasm of breast: Secondary | ICD-10-CM | POA: Diagnosis not present

## 2019-03-04 DIAGNOSIS — M8588 Other specified disorders of bone density and structure, other site: Secondary | ICD-10-CM | POA: Diagnosis not present

## 2019-03-04 DIAGNOSIS — E559 Vitamin D deficiency, unspecified: Secondary | ICD-10-CM | POA: Diagnosis not present

## 2019-04-22 DIAGNOSIS — Z23 Encounter for immunization: Secondary | ICD-10-CM | POA: Diagnosis not present

## 2019-04-22 DIAGNOSIS — Z0000 Encounter for general adult medical examination without abnormal findings: Secondary | ICD-10-CM | POA: Diagnosis not present

## 2019-07-08 IMAGING — CR DG LUMBAR SPINE 2-3V
3 series · 3 of 3 positions shown · non-contrast
Comparison: None.

CLINICAL DATA: 57-year-old female with low back pain and bilateral
sciatica for 6-8 months. Initial encounter.

EXAM:
LUMBAR SPINE - 2-3 VIEW

[t l-spine a.p.]
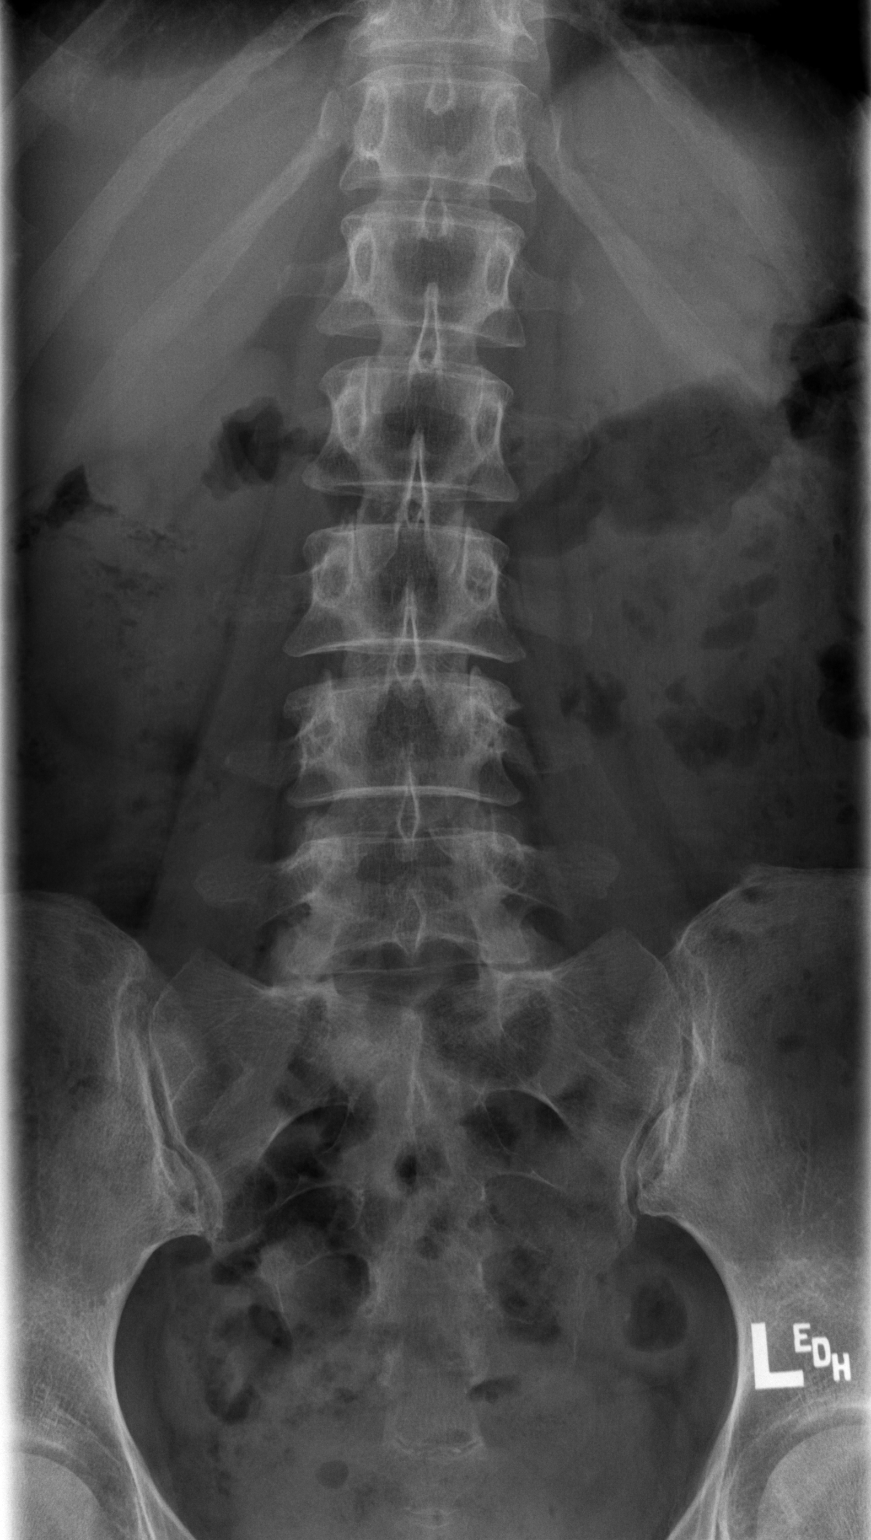

[t l-spine lat]
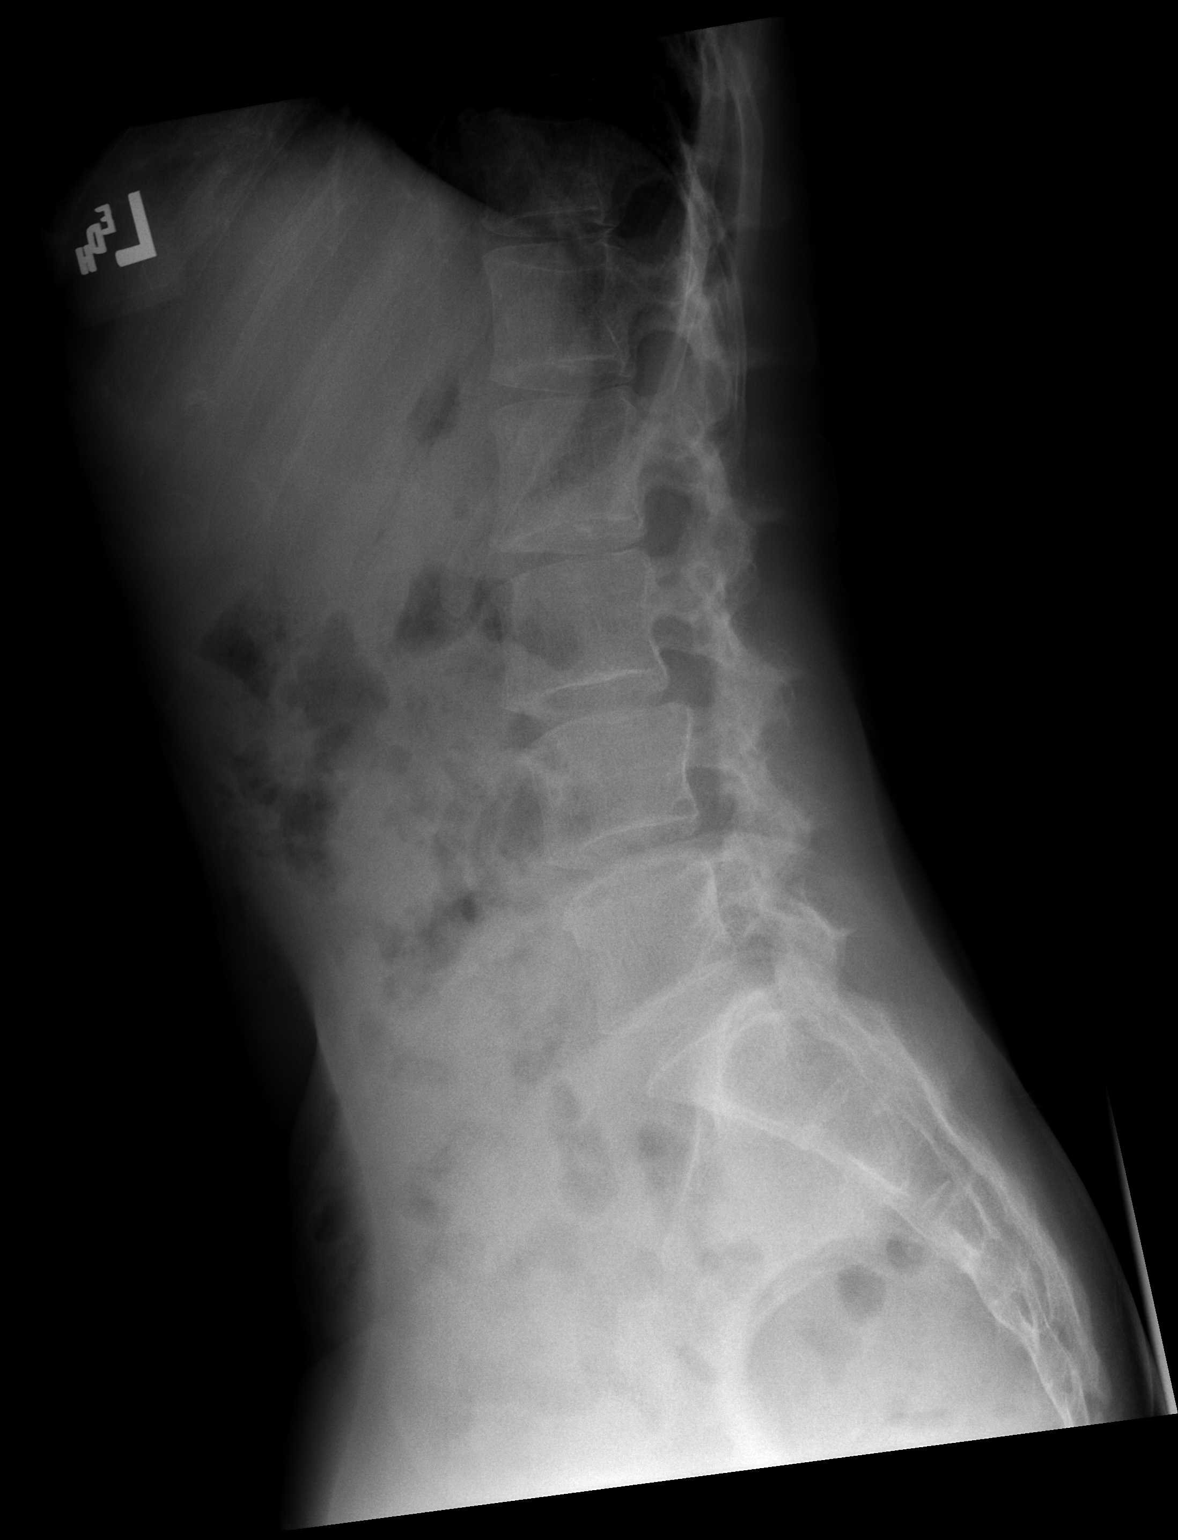

[t l-spine l5-s1 spot]
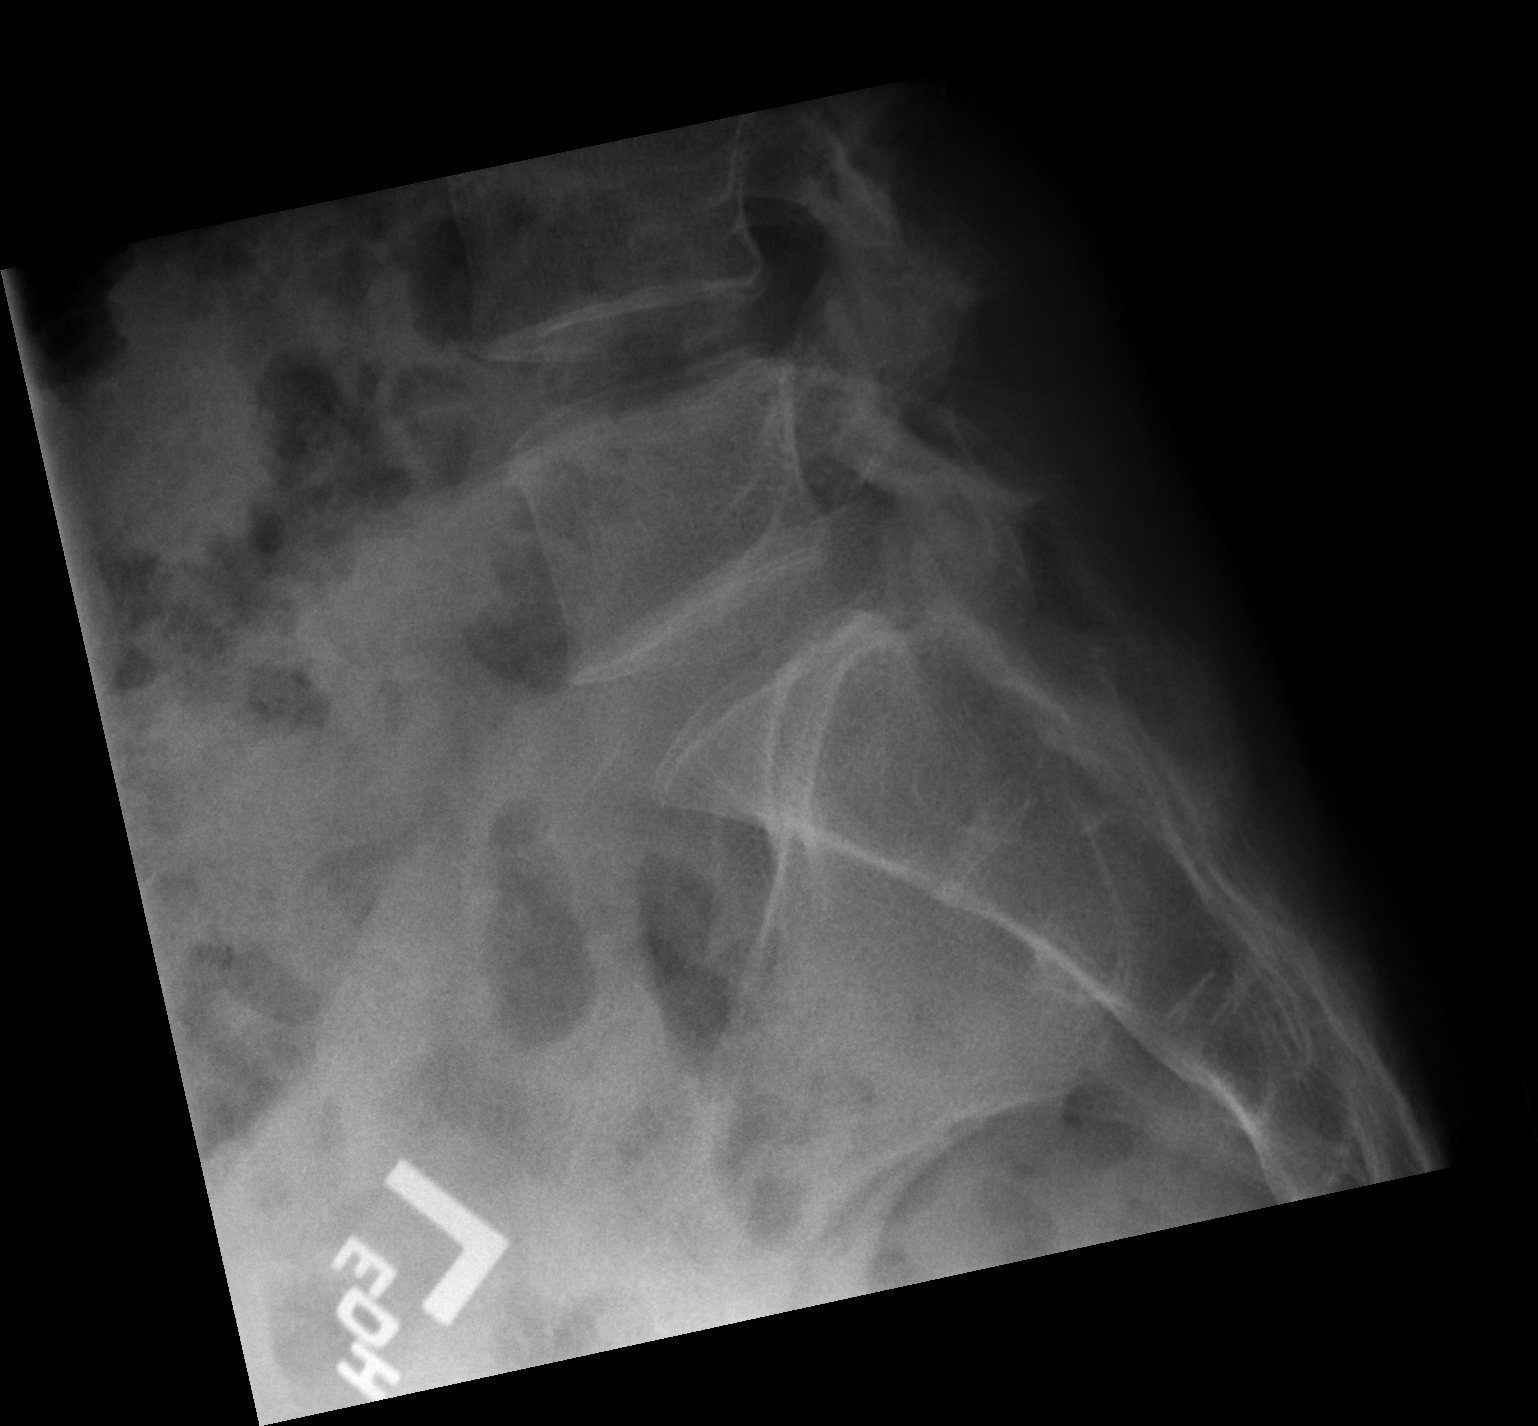

[3 of 3 positions shown; findings below may reference images not displayed]

FINDINGS: Minimal curvature/straightening lumbar spine.

No significant lumbar disc space narrowing.

No compression fracture.

Normal bowel gas pattern.
IMPRESSION: Minimal curvature/straightening lumbar spine.

No significant lumbar disc space narrowing.

## 2019-09-17 ENCOUNTER — Telehealth (INDEPENDENT_AMBULATORY_CARE_PROVIDER_SITE_OTHER): Payer: Self-pay | Admitting: Physician Assistant

## 2019-09-17 ENCOUNTER — Other Ambulatory Visit: Payer: Self-pay

## 2019-09-17 ENCOUNTER — Encounter: Payer: Self-pay | Admitting: Physician Assistant

## 2019-09-17 VITALS — Ht 67.0 in | Wt 118.0 lb

## 2019-09-17 DIAGNOSIS — I341 Nonrheumatic mitral (valve) prolapse: Secondary | ICD-10-CM

## 2019-09-17 NOTE — Progress Notes (Signed)
Virtual Visit via Telephone Note   This visit type was conducted due to national recommendations for restrictions regarding the COVID-19 Pandemic (e.g. social distancing) in an effort to limit this patient's exposure and mitigate transmission in our community.  Due to her co-morbid illnesses, this patient is at least at moderate risk for complications without adequate follow up.  This format is felt to be most appropriate for this patient at this time.  The patient did not have access to video technology/had technical difficulties with video requiring transitioning to audio format only (telephone).  All issues noted in this document were discussed and addressed.  No physical exam could be performed with this format.  Please refer to the patient's chart for her  consent to telehealth for [facility].   The patient was identified using 2 identifiers.  Date:  09/17/2019   ID:  Tara James, DOB Oct 12, 1960, MRN [id number]  Patient Location: Home Provider Location: Other:  Home Office  PCP:  Caren Macadam, MD  Cardiologist:  [name], MD   Electrophysiologist:  None   Evaluation Performed:  Follow-Up Visit  Chief Complaint: Mitral valve prolapse  Patient Profile: Tara James is a 59 y.o. 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 Acie Fredrickson in clinic in March 2019.  She is seen for follow-up.  She is doing well.  She has not had shortness of breath, chest pain, syncope, orthopnea, lower extremity swelling, cough or wheezing.  She has not been cycling for a long time and would like to get back into this activity.   Past Medical History:  Diagnosis Date  . Allergy to  poison ivy   . Fibroid tumor   . MVP (mitral valve prolapse)   . Syncope and collapse    Past Surgical History:  Procedure Laterality Date  . MYOMECTOMY  2001     Current Meds  Medication Sig  . ALPRAZolam (XANAX) 0.5 MG tablet Take 0.5 tablets (0.25 mg total) by mouth at bedtime as needed.  Marland Kitchen CALCIUM PO Take 1 tablet by mouth daily.   . Cholecalciferol (VITAMIN D-3 PO) Take 1 tablet by mouth daily.   Marland Kitchen estradiol (VIVELLE-DOT) 0.05 MG/24HR patch Place 1 patch (0.05 mg total) onto the skin 2 (two) times a week.  . progesterone (PROMETRIUM) 100 MG capsule Take 1 capsule (100 mg total) by mouth daily.  . propranolol (INDERAL) 10 MG tablet Take 10 mg by mouth 4 (four) times daily as needed (PALPATAITIONS).     Allergies:   Doxycycline and Penicillins   Social History   Tobacco Use  . Smoking status: Former Smoker    Packs/day: 0.50    Years: 4.00    Pack years: 2.00    Types: Cigarettes    Quit date: 08/30/1985    Years since quitting: 34.0  . Smokeless tobacco: Never Used  Substance Use Topics  . Alcohol use: Yes    Alcohol/week: 3.0 standard drinks    Types: 3 Glasses of wine per week    Comment: wine  . Drug use: No     Family Hx: The patient's family history includes Cancer in her maternal grandmother; Diabetes in her paternal grandmother; Heart disease in her  father. There is no history of Colon cancer or Breast cancer.  ROS:   Please see the history of present illness.      Labs/Other Tests and Data Reviewed:    EKG:  No ECG reviewed.  Recent Labs: No results found for requested labs within last 8760 hours.   Recent Lipid Panel Lab Results  Component Value Date/Time   CHOL 194 08/04/2014 09:18 AM   TRIG 71 08/04/2014 09:18 AM   HDL 58 08/04/2014 09:18 AM   CHOLHDL 3.3 08/04/2014 09:18 AM   LDLCALC 122 (H) 08/04/2014 09:18 AM    Wt Readings from Last 3 Encounters:  09/17/19 118 lb (53.5 kg)  06/23/17 119 lb 1.9 oz (54 kg)  05/19/15 120 lb 12.8 oz  (54.8 kg)     Objective:    Vital Signs:  Ht 5\' 7"  (1.702 m)   Wt 118 lb (53.5 kg)   BMI 18.48 kg/m    VITAL SIGNS:  reviewed GEN:  no acute distress RESPIRATORY:  Normal respiratory effort PSYCH:  normal affect  ASSESSMENT & PLAN:    1. Mitral valve prolapse Moderate mitral valve prolapse by echocardiogram in March 2020.  She had trivial mitral regurgitation at that time.  She has no symptoms to suggest worsening valve disease.  Continue current therapy.  She can certainly exercise and I have advised her to increase her activity slowly.  We will plan on a follow-up again in 2 years with a repeat echocardiogram prior to that visit.    Time:   Today, I have spent 9 minutes with the patient with telehealth technology discussing the above problems.     Medication Adjustments/Labs and Tests Ordered: Current medicines are reviewed at length with the patient today.  Concerns regarding medicines are outlined above.   Tests Ordered: No orders of the defined types were placed in this encounter.   Medication Changes: No orders of the defined types were placed in this encounter.   Follow Up:  In Person in 2 year(s)  Signed, Richardson Dopp, PA-C  09/17/2019 5:12 PM    [place]

## 2019-09-17 NOTE — Patient Instructions (Signed)
Medication Instructions:   Your physician recommends that you continue on your current medications as directed. Please refer to the Current Medication list given to you today.  *If you need a refill on your cardiac medications before your next appointment, please call your pharmacy*  Lab Work:  None ordered today  Testing/Procedures:  Your physician has requested that you have an repeat echocardiogram in 2 years. Echocardiography is a painless test that uses sound waves to create images of your heart. It provides your doctor with information about the size and shape of your heart and how well your heart's chambers and valves are working. This procedure takes approximately one hour. There are no restrictions for this procedure.  Follow-Up: At [facility], you and your health needs are our priority.  As part of our continuing mission to provide you with exceptional heart care, we have created designated Provider Care Teams.  These Care Teams include your primary Cardiologist (physician) and Advanced Practice Providers (APPs -  Physician Assistants and Nurse Practitioners) who all work together to provide you with the care you need, when you need it.  We recommend signing up for the patient portal called "MyChart".  Sign up information is provided on this After Visit Summary.  MyChart is used to connect with patients for Virtual Visits (Telemedicine).  Patients are able to view lab/test results, encounter notes, upcoming appointments, etc.  Non-urgent messages can be sent to your provider as well.   To learn more about what you can do with MyChart, go to [email or web address].    Your next appointment:   2 year(s)  The format for your next appointment:   In Person  Provider:   You may see [name], MD or Richardson Dopp, PA-C

## 2020-03-30 ENCOUNTER — Other Ambulatory Visit: Payer: Self-pay | Admitting: Family Medicine

## 2020-03-30 DIAGNOSIS — Z1231 Encounter for screening mammogram for malignant neoplasm of breast: Secondary | ICD-10-CM

## 2020-05-12 ENCOUNTER — Ambulatory Visit: Payer: Self-pay

## 2020-07-03 ENCOUNTER — Other Ambulatory Visit: Payer: Self-pay | Admitting: *Deleted

## 2020-07-03 MED ORDER — PROPRANOLOL HCL 10 MG PO TABS: 10.0000 mg | ORAL_TABLET | Freq: Four times a day (QID) | ORAL | 3 refills | Status: DC | PRN

## 2020-07-27 ENCOUNTER — Ambulatory Visit
Admission: RE | Admit: 2020-07-27 | Discharge: 2020-07-27 | Disposition: A | Discharge status: Home / Self Care | Payer: BC Managed Care – PPO | Source: Ambulatory Visit | Attending: Family Medicine | Admitting: Family Medicine

## 2020-07-27 ENCOUNTER — Other Ambulatory Visit: Payer: Self-pay

## 2020-07-27 DIAGNOSIS — Z1231 Encounter for screening mammogram for malignant neoplasm of breast: Secondary | ICD-10-CM

## 2020-10-04 ENCOUNTER — Other Ambulatory Visit: Payer: Self-pay | Admitting: Physician Assistant

## 2020-11-22 IMAGING — MG DIGITAL SCREENING BILAT W/ TOMO W/ CAD
8 series · 9 of 24 positions shown · non-contrast
Comparison: Previous exam(s).

CLINICAL DATA: Screening.

EXAM:
DIGITAL SCREENING BILATERAL MAMMOGRAM WITH TOMO AND CAD

[R MLO synth-2D]
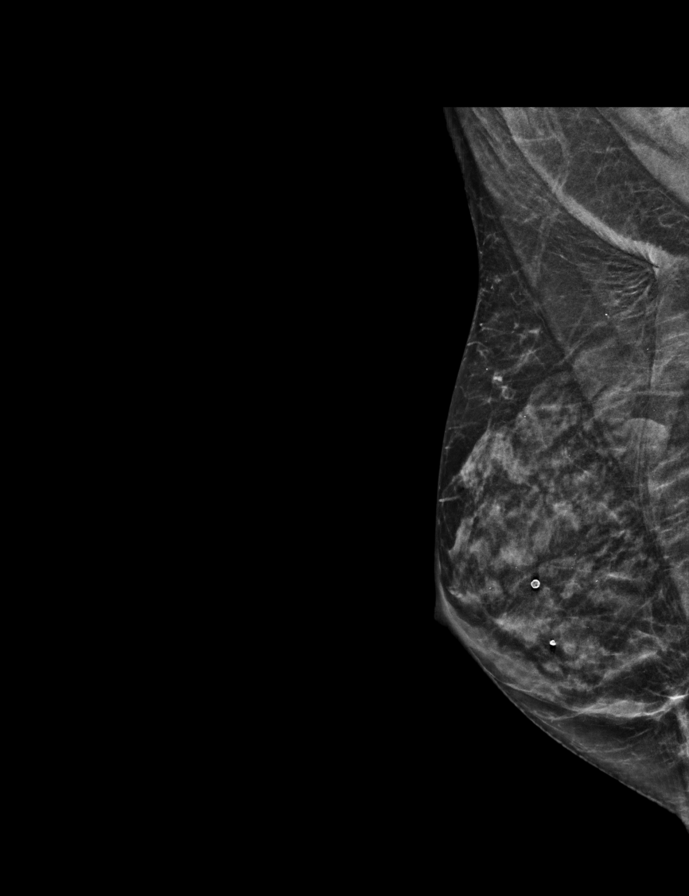

[L MLO synth-2D]
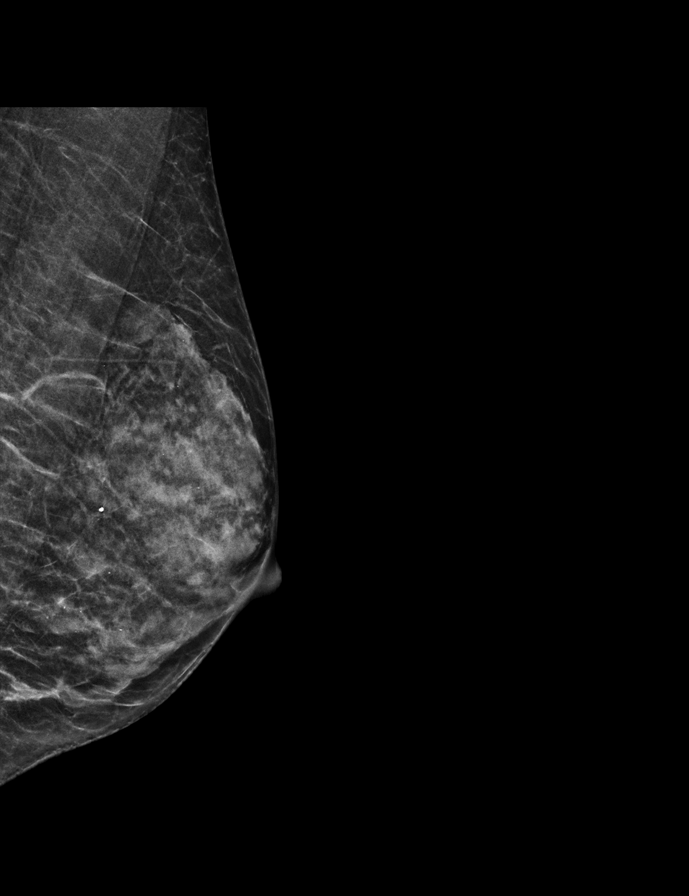

[L CC synth-2D]
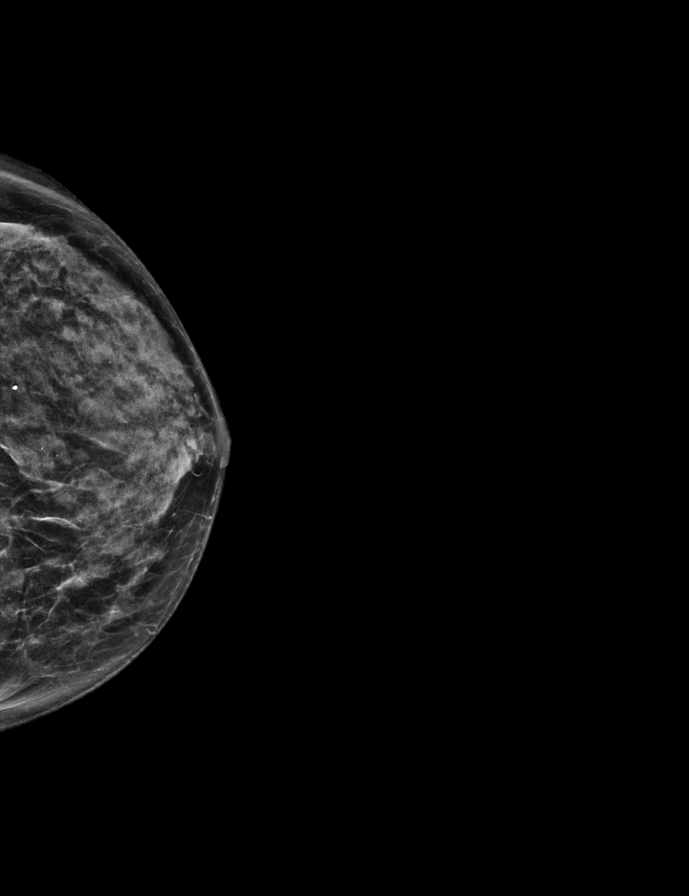

[R CC synth-2D]
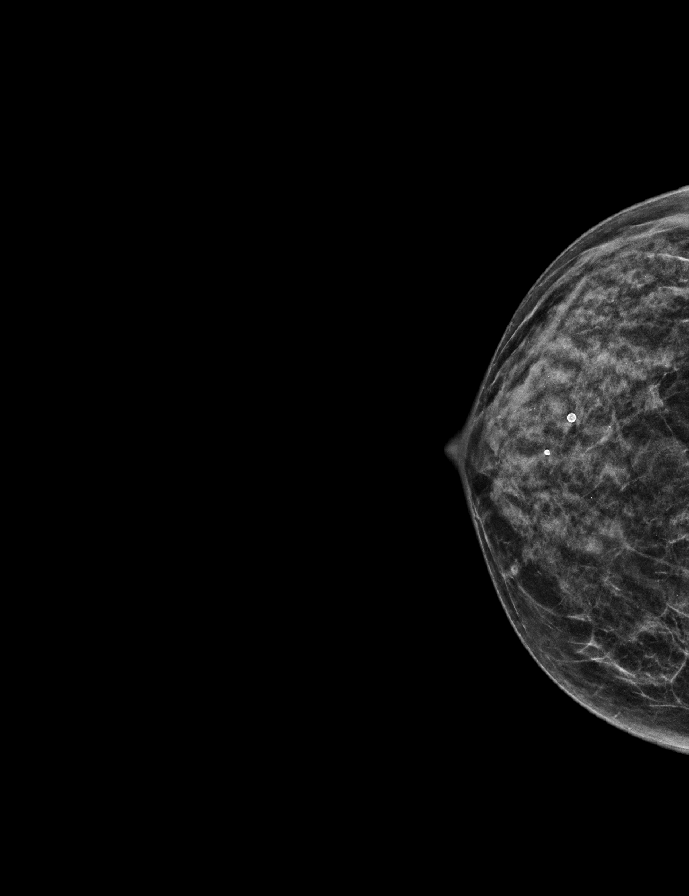

[R CC tomo · 2 of 38 frames shown]
[frame 13/38]
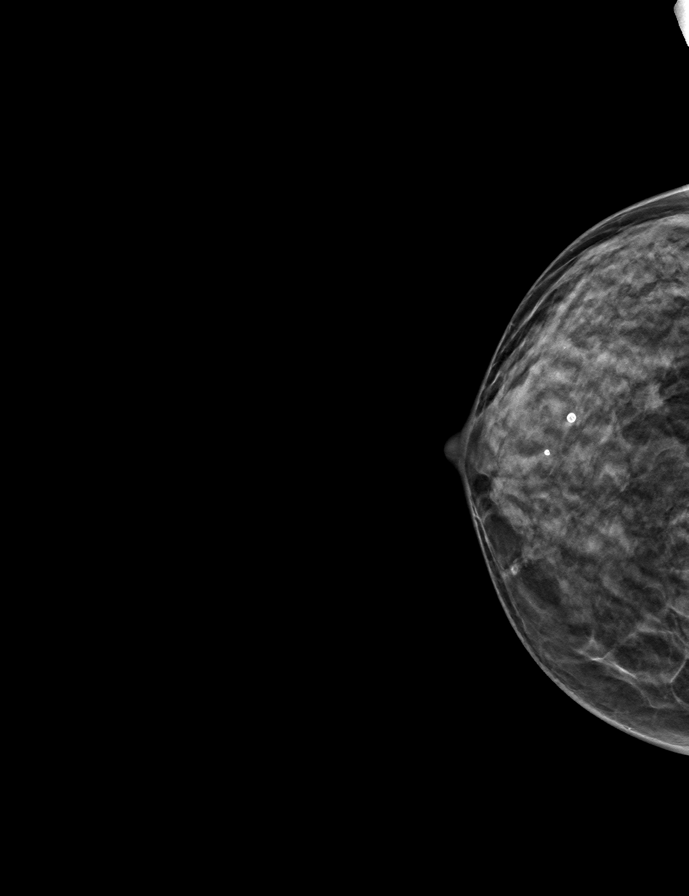
[frame 19/38]
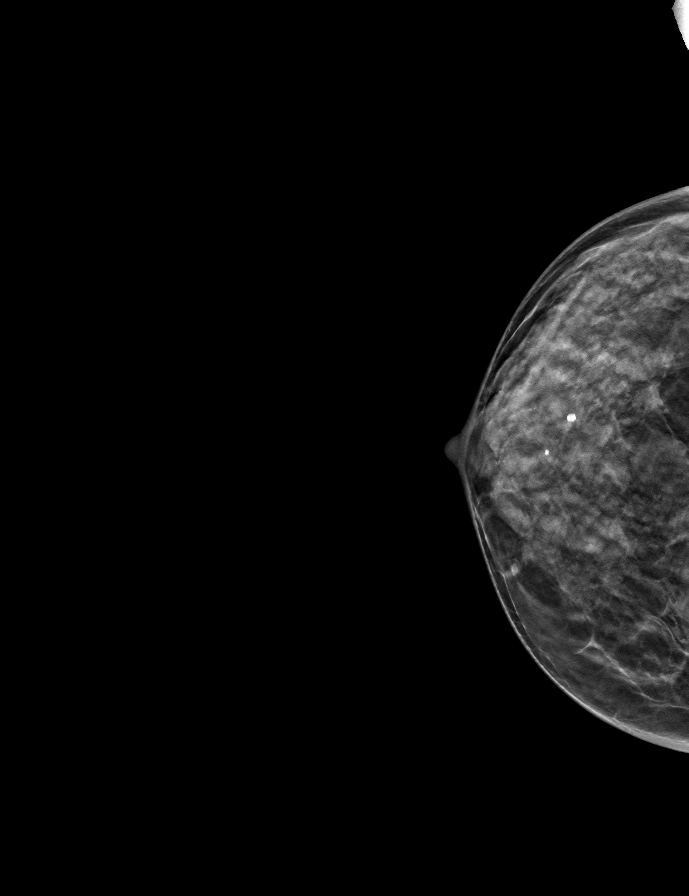

[L MLO tomo · tomo slice 19/38.0]
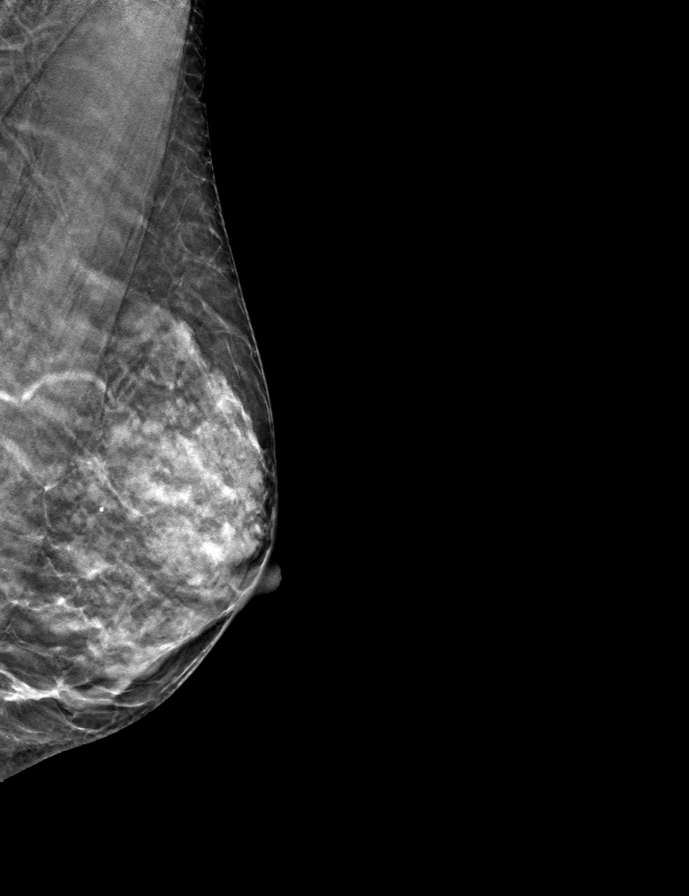

[R MLO tomo · tomo slice 23/45.0]
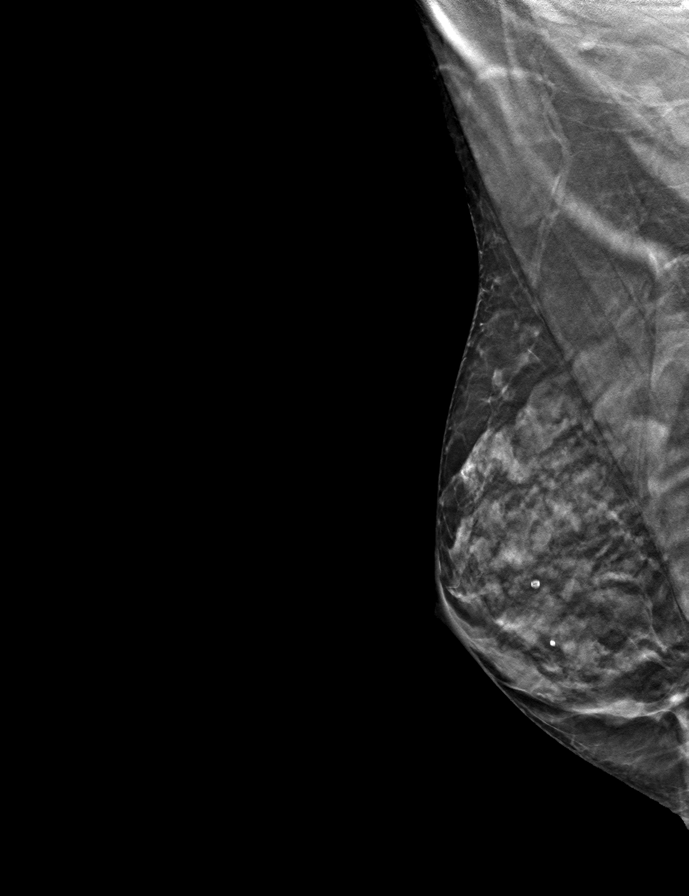

[L CC tomo · tomo slice 21/41.0]
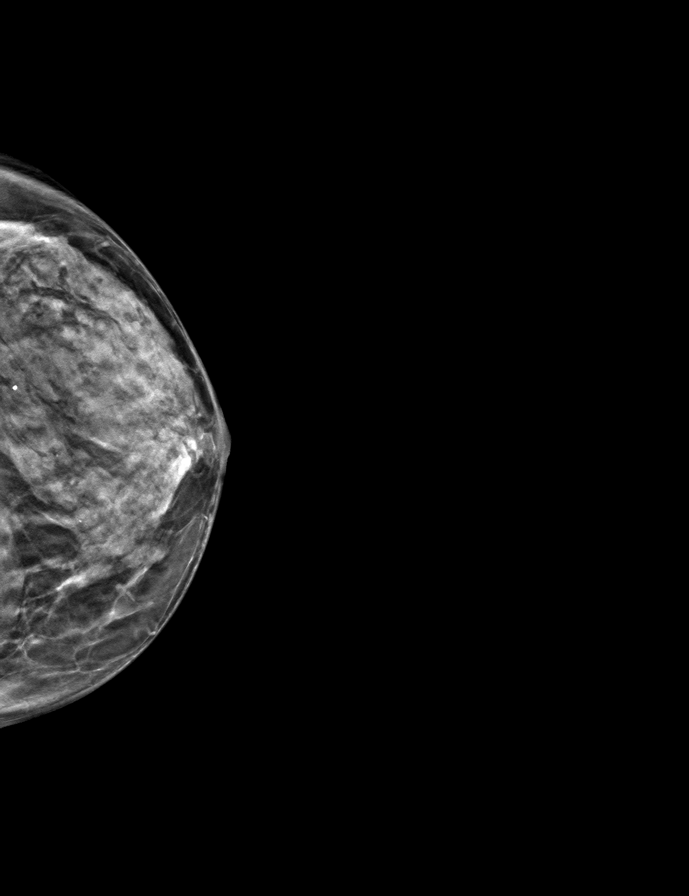

[9 of 24 positions shown; findings below may reference images not displayed]

ACR Breast Density Category d: The breast tissue is extremely dense,
which lowers the sensitivity of mammography
FINDINGS: There are no findings suspicious for malignancy. Images were
processed with CAD.
IMPRESSION: No mammographic evidence of malignancy. A result letter of this
screening mammogram will be mailed directly to the patient.

RECOMMENDATION:
Screening mammogram in one year. (Code:WO-0-ZI0)

BI-RADS CATEGORY  1: Negative.

## 2021-01-11 NOTE — H&P (Signed)
Tara James is an 60 y.o. G2P2 who is admitted for Hysteroscopy with Dilation and Curettage for postmenopausal bleeding and endometrial mass (suspected endometrial polyp).  Patient is currently using and Estradiol patch and Progesterone daily for HRT. She initially presented on 10/29/20 for PMB. In mid-June, patient reported episode of spotting x 4 days with associated pelvic cramping.    Work-up: TVUS (11/10/20): Uterus 8.21 x 5.20 x 4.74cm, endometrial thickness 0.34cm, Fibroid 1 2.27cm, Fibroid 2 1.49cm, left ovary 2.20cm. Retroverted uterus. Uterine fibroids - anterior 2.6 x 1.6 x 1.6cm, posterior 1.5 x 1.4 x 1.4cm. Endometrium thin with 2 echogenic areas with posterior shadowing - 0.8 x 0.7 x 0.5cm, 0.3 x 0.3 x 0.2cm; questionable calcifications vs mass, no blood flow noted. Left ovary WNL. Right ovary not visualized. No adnexal masses seen. Large amount of bowel gas noted throughout pelvis.  EMB (10/2020): Proliferative endometrium with breakdown changes. No hyperplasia or carcinoma.   Last pap: NILM/HRHPV negative 2019  Patient Active Problem List   Diagnosis Date Noted   Bradycardia 04/11/2014   Vaginal spotting 02/17/2014   Diverticulosis 09/24/2013   Osteopenia  -1.8 spine GYN office Dexa 09/16/2013   Post-menopausal bleeding  Progesterone withdrawal (See 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 see 3/14 note 07/28/2012   Fibroids 07/28/2012   Menopause 10/06/2011   Palpitations 10/06/2011   Premature ventricular contraction 09/23/2010   Mitral valve prolapse 09/23/2010    MEDICAL/FAMILY/SOCIAL HX: No LMP recorded. Patient is postmenopausal.    Past Medical History:  Diagnosis Date   Allergy to poison ivy    Fibroid tumor    MVP (mitral valve prolapse)    Syncope and collapse     Past Surgical History:  Procedure Laterality Date   MYOMECTOMY   2001    Family History  Problem Relation Age of Onset   Heart disease Father    Diabetes Paternal Grandmother    Cancer Maternal Grandmother        breast   Colon cancer Neg Hx    Breast cancer Neg Hx     Social History:  reports that she quit smoking about 35 years ago. Her smoking use included cigarettes. She has a 2.00 pack-year smoking history. She has never used smokeless tobacco. She reports current alcohol use of about 3.0 standard drinks per week. She reports that she does not use drugs.  ALLERGIES/MEDS:  Allergies:  Allergies  Allergen Reactions   Doxycycline Rash   Penicillins Rash    No medications prior to admission.     Review of Systems  Constitutional: Negative.   HENT: Negative.    Eyes: Negative.   Respiratory: Negative.    Cardiovascular:  Negative for leg swelling.  Gastrointestinal: Negative.   Genitourinary: Negative.   Musculoskeletal: Negative.   Skin: Negative.   Neurological: Negative.   Endo/Heme/Allergies: Negative.   Psychiatric/Behavioral: Negative.     There were no vitals taken for this visit. Gen:  NAD, pleasant and cooperative Cardio:  RRR Pulm:  CTAB, no wheezes/rales/rhonchi Abd:  Soft, non-distended, non-tender throughout, no rebound/guarding Ext:  No bilateral LE edema, no bilateral calf tenderness Pelvic (performed by Evlyn Kanner, FNP-C): External genitalia - normal, no lesions, no erythema, urethral meatus - normal without lesions or discharge, vagina - healthy pink mucosa without any lesions, scant blood, cervix - grossly normal without lesions, scant blood at stenotic os, no  MCT, uterus - normal size, shape, and consistentcy, normal mobility, non-tender, adnexa - no masses or tenderness bilaterally.  No results found for this or any previous visit (from the past 24 hour(s)).  No results found.   ASSESSMENT/PLAN: Tara James is a 60 y.o. G2P2 who is admitted for Hysteroscopy with Dilation and Curettage for  postmenopausal bleeding and endometrial mass (suspected endometrial polyp).  - Admit to [facility] - Admit labs (CBC, T&S, COVID screen) - Diet:  Per anesthesia - IVF:  Per anesthesia - VTE Prophylaxis:  SCDs - Antibiotics: None - Anticipate same day discharge  Consents: I discussed with the patient that this surgery is performed to look inside the uterus and remove the uterine lining.  Prior to surgery, the risks and benefits of the surgery, as well as alternative treatments, have been discussed.  The risks include, but are not limited to bleeding, including the need for a blood transfusion, infection, damage to organs and tissues, including uterine perforation, requiring additional surgery, postoperative pain, short-term and long-term, failure of the procedure to control symptoms, need for hysterectomy to control bleeding, fluid overload, which could create electrolyte abnormalities and the need to stop the procedure before completion, inability to safely complete the procedure, deep vein thrombosis and/or pulmonary embolism, painful intercourse, complications the course of which cannot be predicted or prevented, and death.  Patient was consented for blood products.  The patient is aware that bleeding may result in the need for a blood transfusion which includes risk of transmission of HIV (1:2 million), Hepatitis C (1:2 million), and Hepatitis B (1:200 thousand) and transfusion reaction.  Patient voiced understanding of the above risks as well as understanding of indications for blood transfusion.   Drema Dallas, DO [phone] (office)

## 2021-01-25 ENCOUNTER — Encounter (HOSPITAL_BASED_OUTPATIENT_CLINIC_OR_DEPARTMENT_OTHER): Payer: Self-pay | Admitting: Obstetrics and Gynecology

## 2021-01-25 ENCOUNTER — Other Ambulatory Visit: Payer: Self-pay

## 2021-01-25 NOTE — Progress Notes (Signed)
Spoke w/ via phone for pre-op interview---pt Lab needs dos----  cbc  I stat t & s, ekg            Lab results------echo 06-26-2018 chart/epic, lov [facility] 09-17-2019 epic f/u in 2 years  COVID test -----patient states asymptomatic no test needed Arrive at -------1045 am 01-28-2021 NPO after MN NO Solid Food.  Clear liquids from MN until---945 am Med rec completed Medications to take morning of surgery -----estradiol patch, propranolol prn Diabetic medication -----n/a Patient instructed no nail polish to be worn day of surgery Patient instructed to bring photo id and insurance card day of surgery Patient aware to have Driver (ride ) / caregiver spouse gary    for 24 hours after surgery  Patient Special Instructions -----none Pre-Op special Istructions -----none Patient verbalized understanding of instructions that were given at this phone interview. Patient denies shortness of breath, chest pain, fever, cough at this phone interview.

## 2021-01-28 ENCOUNTER — Encounter (HOSPITAL_BASED_OUTPATIENT_CLINIC_OR_DEPARTMENT_OTHER)
Admission: RE | Disposition: A | Discharge status: Home / Self Care | Payer: Self-pay | Source: Ambulatory Visit | Attending: Obstetrics and Gynecology

## 2021-01-28 ENCOUNTER — Ambulatory Visit (HOSPITAL_BASED_OUTPATIENT_CLINIC_OR_DEPARTMENT_OTHER)
Admission: RE | Admit: 2021-01-28 | Discharge: 2021-01-28 | Disposition: A | Discharge status: Home / Self Care | Payer: BC Managed Care – PPO | Source: Ambulatory Visit | Attending: Obstetrics and Gynecology | Admitting: Obstetrics and Gynecology

## 2021-01-28 ENCOUNTER — Ambulatory Visit (HOSPITAL_BASED_OUTPATIENT_CLINIC_OR_DEPARTMENT_OTHER): Payer: BC Managed Care – PPO | Admitting: Certified Registered"

## 2021-01-28 ENCOUNTER — Encounter (HOSPITAL_BASED_OUTPATIENT_CLINIC_OR_DEPARTMENT_OTHER): Payer: Self-pay | Admitting: Obstetrics and Gynecology

## 2021-01-28 ENCOUNTER — Other Ambulatory Visit: Payer: Self-pay

## 2021-01-28 DIAGNOSIS — Z880 Allergy status to penicillin: Secondary | ICD-10-CM | POA: Insufficient documentation

## 2021-01-28 DIAGNOSIS — N840 Polyp of corpus uteri: Secondary | ICD-10-CM | POA: Diagnosis not present

## 2021-01-28 DIAGNOSIS — N939 Abnormal uterine and vaginal bleeding, unspecified: Secondary | ICD-10-CM

## 2021-01-28 DIAGNOSIS — Z881 Allergy status to other antibiotic agents status: Secondary | ICD-10-CM | POA: Insufficient documentation

## 2021-01-28 DIAGNOSIS — Z79890 Hormone replacement therapy (postmenopausal): Secondary | ICD-10-CM | POA: Diagnosis not present

## 2021-01-28 DIAGNOSIS — N950 Postmenopausal bleeding: Secondary | ICD-10-CM | POA: Diagnosis present

## 2021-01-28 DIAGNOSIS — N882 Stricture and stenosis of cervix uteri: Secondary | ICD-10-CM | POA: Diagnosis not present

## 2021-01-28 DIAGNOSIS — N9489 Other specified conditions associated with female genital organs and menstrual cycle: Secondary | ICD-10-CM

## 2021-01-28 DIAGNOSIS — Z87891 Personal history of nicotine dependence: Secondary | ICD-10-CM | POA: Diagnosis not present

## 2021-01-28 HISTORY — DX: Presence of spectacles and contact lenses: Z97.3

## 2021-01-28 HISTORY — DX: Other complications of anesthesia, initial encounter: T88.59XA

## 2021-01-28 HISTORY — PX: DILATATION & CURETTAGE/HYSTEROSCOPY WITH MYOSURE: SHX6511

## 2021-01-28 LAB — POCT I-STAT, CHEM 8
BUN: 18 mg/dL (ref 6–20)
Calcium, Ion: 1.21 mmol/L (ref 1.15–1.40)
Chloride: 101 mmol/L (ref 98–111)
Creatinine, Ser: 0.6 mg/dL (ref 0.44–1.00)
Glucose, Bld: 95 mg/dL (ref 70–99)
HCT: 45 % (ref 36.0–46.0)
Hemoglobin: 15.3 g/dL — ABNORMAL HIGH (ref 12.0–15.0)
Potassium: 3.8 mmol/L (ref 3.5–5.1)
Sodium: 140 mmol/L (ref 135–145)
TCO2: 29 mmol/L (ref 22–32)

## 2021-01-28 LAB — TYPE AND SCREEN
ABO/RH(D): O POS
Antibody Screen: NEGATIVE

## 2021-01-28 LAB — ABO/RH: ABO/RH(D): O POS

## 2021-01-28 SURGERY — DILATATION & CURETTAGE/HYSTEROSCOPY WITH MYOSURE
Anesthesia: General

## 2021-01-28 MED ORDER — FENTANYL CITRATE (PF) 100 MCG/2ML IJ SOLN
INTRAMUSCULAR | Status: DC | PRN
  Administered 2021-01-28: 25 ug via INTRAVENOUS
  Administered 2021-01-28: 50 ug via INTRAVENOUS

## 2021-01-28 MED ORDER — ESTRADIOL 0.025 MG/24HR TD PTTW: 1.0000 | MEDICATED_PATCH | TRANSDERMAL | 4 refills | Status: DC

## 2021-01-28 MED ORDER — DEXAMETHASONE SODIUM PHOSPHATE 10 MG/ML IJ SOLN
INTRAMUSCULAR | Status: DC | PRN
  Administered 2021-01-28: 8 mg via INTRAVENOUS

## 2021-01-28 MED ORDER — BUPIVACAINE HCL (PF) 0.25 % IJ SOLN
INTRAMUSCULAR | Status: AC
  Filled 2021-01-28: qty 30

## 2021-01-28 MED ORDER — ONDANSETRON HCL 4 MG/2ML IJ SOLN
INTRAMUSCULAR | Status: AC
  Filled 2021-01-28: qty 2

## 2021-01-28 MED ORDER — OXYCODONE HCL 5 MG PO TABS
ORAL_TABLET | ORAL | Status: AC
  Filled 2021-01-28: qty 1

## 2021-01-28 MED ORDER — FENTANYL CITRATE (PF) 100 MCG/2ML IJ SOLN
INTRAMUSCULAR | Status: AC
  Filled 2021-01-28: qty 2

## 2021-01-28 MED ORDER — AMISULPRIDE (ANTIEMETIC) 5 MG/2ML IV SOLN
10.0000 mg | Freq: Once | INTRAVENOUS | Status: AC
  Administered 2021-01-28: 10 mg via INTRAVENOUS

## 2021-01-28 MED ORDER — PROMETHAZINE HCL 25 MG/ML IJ SOLN: 6.2500 mg | INTRAMUSCULAR | Status: DC | PRN

## 2021-01-28 MED ORDER — MIDAZOLAM HCL 5 MG/5ML IJ SOLN
INTRAMUSCULAR | Status: DC | PRN
  Administered 2021-01-28: 2 mg via INTRAVENOUS

## 2021-01-28 MED ORDER — SILVER NITRATE-POT NITRATE 75-25 % EX MISC
CUTANEOUS | Status: DC | PRN
  Administered 2021-01-28: 2
  Administered 2021-01-28: 3

## 2021-01-28 MED ORDER — MIDAZOLAM HCL 2 MG/2ML IJ SOLN
INTRAMUSCULAR | Status: AC
  Filled 2021-01-28: qty 2

## 2021-01-28 MED ORDER — OXYCODONE HCL 5 MG/5ML PO SOLN: 5.0000 mg | Freq: Once | ORAL | Status: AC | PRN

## 2021-01-28 MED ORDER — OXYCODONE HCL 5 MG PO TABS
5.0000 mg | ORAL_TABLET | Freq: Once | ORAL | Status: AC | PRN
  Administered 2021-01-28: 5 mg via ORAL

## 2021-01-28 MED ORDER — LIDOCAINE 2% (20 MG/ML) 5 ML SYRINGE
INTRAMUSCULAR | Status: DC | PRN
  Administered 2021-01-28: 60 mg via INTRAVENOUS

## 2021-01-28 MED ORDER — ACETAMINOPHEN 500 MG PO TABS
ORAL_TABLET | ORAL | Status: AC
  Filled 2021-01-28: qty 2

## 2021-01-28 MED ORDER — AMISULPRIDE (ANTIEMETIC) 5 MG/2ML IV SOLN
INTRAVENOUS | Status: AC
  Filled 2021-01-28: qty 4

## 2021-01-28 MED ORDER — KETOROLAC TROMETHAMINE 30 MG/ML IJ SOLN
INTRAMUSCULAR | Status: DC | PRN
  Administered 2021-01-28: 30 mg via INTRAVENOUS

## 2021-01-28 MED ORDER — FENTANYL CITRATE (PF) 100 MCG/2ML IJ SOLN
25.0000 ug | INTRAMUSCULAR | Status: DC | PRN
  Administered 2021-01-28 (×4): 25 ug via INTRAVENOUS

## 2021-01-28 MED ORDER — LIDOCAINE 2% (20 MG/ML) 5 ML SYRINGE
INTRAMUSCULAR | Status: AC
  Filled 2021-01-28: qty 5

## 2021-01-28 MED ORDER — LACTATED RINGERS IV SOLN: INTRAVENOUS | Status: DC

## 2021-01-28 MED ORDER — IBUPROFEN 600 MG PO TABS: 600.0000 mg | ORAL_TABLET | Freq: Four times a day (QID) | ORAL | 0 refills | Status: DC | PRN

## 2021-01-28 MED ORDER — DEXAMETHASONE SODIUM PHOSPHATE 10 MG/ML IJ SOLN
INTRAMUSCULAR | Status: AC
  Filled 2021-01-28: qty 1

## 2021-01-28 MED ORDER — SODIUM CHLORIDE 0.9 % IR SOLN
Status: DC | PRN
  Administered 2021-01-28: 3000 mL

## 2021-01-28 MED ORDER — ACETAMINOPHEN 500 MG PO TABS
1000.0000 mg | ORAL_TABLET | Freq: Once | ORAL | Status: AC
  Administered 2021-01-28: 1000 mg via ORAL

## 2021-01-28 MED ORDER — ONDANSETRON HCL 4 MG/2ML IJ SOLN
INTRAMUSCULAR | Status: DC | PRN
  Administered 2021-01-28: 4 mg via INTRAVENOUS

## 2021-01-28 MED ORDER — ESTRADIOL 0.05 MG/24HR TD PTTW: 1.0000 | MEDICATED_PATCH | TRANSDERMAL | 4 refills | Status: DC

## 2021-01-28 MED ORDER — KETOROLAC TROMETHAMINE 30 MG/ML IJ SOLN
INTRAMUSCULAR | Status: AC
  Filled 2021-01-28: qty 1

## 2021-01-28 MED ORDER — PROPOFOL 10 MG/ML IV BOLUS
INTRAVENOUS | Status: DC | PRN
  Administered 2021-01-28: 100 mg via INTRAVENOUS

## 2021-01-28 SURGICAL SUPPLY — 16 items
CATH ROBINSON RED A/P 16FR (CATHETERS) ×2 IMPLANT
DEVICE MYOSURE LITE (MISCELLANEOUS) ×2 IMPLANT
DEVICE MYOSURE REACH (MISCELLANEOUS) IMPLANT
DILATOR CANAL MILEX (MISCELLANEOUS) IMPLANT
GAUZE 4X4 16PLY ~~LOC~~+RFID DBL (SPONGE) ×2 IMPLANT
GLOVE SURG ENC MOIS LTX SZ6.5 (GLOVE) ×2 IMPLANT
GLOVE SURG ENC TEXT LTX SZ6.5 (GLOVE) ×2 IMPLANT
GLOVE SURG UNDER POLY LF SZ7 (GLOVE) ×2 IMPLANT
GOWN STRL REUS W/TWL LRG LVL3 (GOWN DISPOSABLE) ×2 IMPLANT
KIT PROCEDURE FLUENT (KITS) ×4 IMPLANT
KIT TURNOVER CYSTO (KITS) ×2 IMPLANT
PACK VAGINAL MINOR WOMEN LF (CUSTOM PROCEDURE TRAY) ×2 IMPLANT
PAD OB MATERNITY 4.3X12.25 (PERSONAL CARE ITEMS) ×2 IMPLANT
SEAL ROD LENS SCOPE MYOSURE (ABLATOR) ×2 IMPLANT
TOWEL OR 17X26 10 PK STRL BLUE (TOWEL DISPOSABLE) ×4 IMPLANT
UNDERPAD 30X36 HEAVY ABSORB (UNDERPADS AND DIAPERS) ×2 IMPLANT

## 2021-01-28 NOTE — Interval H&P Note (Signed)
History and Physical Interval Note:  01/28/2021 1:17 PM  [name]  has presented today for surgery, with the diagnosis of Postmenopausal Bleeding.  The various methods of treatment have been discussed with the patient and family. After consideration of risks, benefits and other options for treatment, the patient has consented to  Procedure(s): [place] (N/A) as a surgical intervention.  The patient's history has been reviewed, patient examined, no change in status, stable for surgery.  I have reviewed the patient's chart and labs.  Questions were answered to the patient's satisfaction.     Drema Dallas

## 2021-01-28 NOTE — Transfer of Care (Signed)
Immediate Anesthesia Transfer of Care Note  Patient: Tara James  Procedure(s) Performed: DILATATION & CURETTAGE/HYSTEROSCOPY WITH MYOSURE  Patient Location: PACU  Anesthesia Type:General  Level of Consciousness: drowsy and patient cooperative  Airway & Oxygen Therapy: Patient Spontanous Breathing and Patient connected to face mask oxygen  Post-op Assessment: Report given to RN and Post -op Vital signs reviewed and stable  Post vital signs: Reviewed and stable  Last Vitals:  Vitals Value Taken Time  BP 122/85 01/28/21 1426  Temp    Pulse 60 01/28/21 1427  Resp 17 01/28/21 1427  SpO2 100 % 01/28/21 1427  Vitals shown include unvalidated device data.  Last Pain:  Vitals:   01/28/21 1120  TempSrc: Oral      Patients Stated Pain Goal: 6 (17/91/50 5697)  Complications: No notable events documented.

## 2021-01-28 NOTE — Anesthesia Postprocedure Evaluation (Signed)
Anesthesia Post Note  Patient: Tara James  Procedure(s) Performed: DILATATION & CURETTAGE/HYSTEROSCOPY WITH [place]     Patient location during evaluation: PACU Anesthesia Type: General Level of consciousness: awake and alert and oriented Pain management: pain level controlled Vital Signs Assessment: post-procedure vital signs reviewed and stable Respiratory status: spontaneous breathing, nonlabored ventilation and respiratory function stable Cardiovascular status: blood pressure returned to baseline and stable Postop Assessment: no apparent nausea or vomiting Anesthetic complications: no   No notable events documented.  Last Vitals:  Vitals:   01/28/21 1500 01/28/21 1515  BP: 110/70 119/62  Pulse: 60 70  Resp: 13 15  Temp:    SpO2: 96% 100%    Last Pain:  Vitals:   01/28/21 1515  TempSrc:   PainSc: 7                  Shreyan Hinz A.

## 2021-01-28 NOTE — Anesthesia Procedure Notes (Signed)
Procedure Name: LMA Insertion Date/Time: 01/28/2021 1:30 PM Performed by: Gwyndolyn Saxon, CRNA Pre-anesthesia Checklist: Patient identified, Emergency Drugs available, Suction available and Patient being monitored Patient Re-evaluated:Patient Re-evaluated prior to induction Oxygen Delivery Method: Circle system utilized Preoxygenation: Pre-oxygenation with 100% oxygen Induction Type: IV induction Ventilation: Mask ventilation without difficulty LMA: LMA inserted LMA Size: 4.0 Number of attempts: 1 Placement Confirmation: positive ETCO2 and breath sounds checked- equal and bilateral Tube secured with: Tape Dental Injury: Teeth and Oropharynx as per pre-operative assessment

## 2021-01-28 NOTE — Anesthesia Preprocedure Evaluation (Addendum)
Anesthesia Evaluation  Patient identified by MRN, date of birth, ID band Patient awake    Reviewed: Allergy & Precautions, NPO status , Patient's Chart, lab work & pertinent test results  History of Anesthesia Complications Negative for: history of anesthetic complications  Airway Mallampati: I  TM Distance: >3 FB Neck ROM: Full    Dental no notable dental hx.    Pulmonary former smoker,    Pulmonary exam normal        Cardiovascular negative cardio ROS Normal cardiovascular exam     Neuro/Psych negative neurological ROS  negative psych ROS   GI/Hepatic negative GI ROS, Neg liver ROS,   Endo/Other  negative endocrine ROS  Renal/GU negative Renal ROS  negative genitourinary   Musculoskeletal negative musculoskeletal ROS (+)   Abdominal   Peds  Hematology negative hematology ROS (+)   Anesthesia Other Findings Day of surgery medications reviewed with patient.  Reproductive/Obstetrics Postmenopausal Bleeding                            Anesthesia Physical Anesthesia Plan  ASA: 1  Anesthesia Plan: General   Post-op Pain Management:    Induction: Intravenous  PONV Risk Score and Plan: 3 and Treatment may vary due to age or medical condition, Ondansetron, Dexamethasone and Midazolam  Airway Management Planned: LMA  Additional Equipment: None  Intra-op Plan:   Post-operative Plan: Extubation in OR  Informed Consent: I have reviewed the patients History and Physical, chart, labs and discussed the procedure including the risks, benefits and alternatives for the proposed anesthesia with the patient or authorized representative who has indicated his/her understanding and acceptance.     Dental advisory given  Plan Discussed with: CRNA  Anesthesia Plan Comments:        Anesthesia Quick Evaluation

## 2021-01-28 NOTE — Op Note (Signed)
Pre Op Dx:   1. Postmenopausal bleeding 2. Endometrial masses on ultrasound  Post Op Dx:   1. Postmenopausal bleeding 2. Cervical polyps 3. Cervical stenosis  Procedure:   1. Hysteroscopy with Dilation and Curettage 2. Myosure Polypectomy   Surgeon:  Dr. Drema Dallas Assistants:  None Anesthesia:  General   EBL:  10cc  IVF:  See anesthesia documentation UOP:  See anesthesia documentation Fluid Deficit:  Less than 100cc   Drains:  None Specimen removed:  Endometrial curettings and cervical polyps - sent to pathology Device(s) implanted: None Case Type:  Clean-contaminated Findings:  Normal-appearing cervix. Significant cervical stenosis. Unable to reach uterine fundus and bilateral tubal ostia were not visualized. Elongated cervix. Cervix with broad based, smooth polyps. Due to distorted anatomy, unable to reach uterine cavity. Complications: None Indications:  60 y.o. G2P2 with postmenopausal bleeding and endometrial masses suspected to be endometrial polyps. Description of each procedure:  After informed consent was obtained the patient was taken to the operating room in the dorsal supine position.  After administration of general anesthesia, the patient was placed in the dorsal lithotomy position and prepped and draped in the usual sterile fashion. Bladder emptied pre-operatively. A pre-operative time-out was completed.  The anterior lip of the cervix was grasped with a single-tooth tenaculum and the cervix was serially dilated to accommodate the hysteroscope. The hysteroscope was advanced and the findings as above was noted. Diagnostic scope placed to visualize inner cavity which was still difficult. Myosure Reach was used to resect the polyps in the endometrial canal. A banjo curette and cervical curette were used to curettage the endometrium. The single-tooth tenaculum was removed and its sites were made hemostatic using silver nitrate. Adequate hemostasis was noted.  The patient  was awakened and extubated and appeared to have tolerated the procedure well.  All counts were correct. Disposition:  PACU  Drema Dallas, DO

## 2021-01-28 NOTE — Discharge Instructions (Addendum)
  Post Anesthesia Home Care Instructions  Activity: Get plenty of rest for the remainder of the day. A responsible individual must stay with you for 24 hours following the procedure.  For the next 24 hours, DO NOT: -Drive a car -Paediatric nurse -Drink alcoholic beverages -Take any medication unless instructed by your physician -Make any legal decisions or sign important papers.  Meals: Start with liquid foods such as gelatin or soup. Progress to regular foods as tolerated. Avoid greasy, spicy, heavy foods. If nausea and/or vomiting occur, drink only clear liquids until the nausea and/or vomiting subsides. Call your physician if vomiting continues.  Special Instructions/Symptoms: Your throat may feel dry or sore from the anesthesia or the breathing tube placed in your throat during surgery. If this causes discomfort, gargle with warm salt water. The discomfort should disappear within 24 hours.   DISCHARGE INSTRUCTIONS: D&C The following instructions have been prepared to help you care for yourself upon your return home.   Personal hygiene:  Use sanitary pads for vaginal drainage, not tampons.  Shower the day after your procedure.  NO tub baths, pools or Jacuzzis for 2-3 weeks.  Wipe front to back after using the bathroom.  Activity and limitations:  Do NOT drive or operate any equipment for 24 hours. The effects of anesthesia are still present and drowsiness may result.  Do NOT rest in bed all day.  Walking is encouraged.  Walk up and down stairs slowly.  You may resume your normal activity in one to two days or as indicated by your physician.  Sexual activity: NO intercourse for at least 2 weeks after the procedure, or as indicated by your physician.  Diet: Eat a light meal as desired this evening. You may resume your usual diet tomorrow.  Return to work: You may resume your work activities in one to two days or as indicated by your doctor.  What to expect after your  surgery: Expect to have vaginal bleeding/discharge for 2-3 days and spotting for up to 10 days. It is not unusual to have soreness for up to 1-2 weeks. You may have a slight burning sensation when you urinate for the first day. Mild cramps may continue for a couple of days. You may have a regular period in 2-6 weeks.  Call your doctor for any of the following:  Excessive vaginal bleeding, saturating and changing one pad every hour.  Inability to urinate 6 hours after discharge from hospital.  Pain not relieved by pain medication.  Fever of 100.4 F or greater.  Unusual vaginal discharge or odor.  **No Tylenol or acetaminophen until 5pm today if needed.  **No ibuprofen, Advil, Aleve, Motrin, ketorolac, meloxicam, or naproxen until after 8pm today if needed.

## 2021-02-01 ENCOUNTER — Encounter (HOSPITAL_BASED_OUTPATIENT_CLINIC_OR_DEPARTMENT_OTHER): Payer: Self-pay | Admitting: Obstetrics and Gynecology

## 2021-02-02 LAB — SURGICAL PATHOLOGY

## 2021-04-04 ENCOUNTER — Other Ambulatory Visit: Payer: Self-pay | Admitting: Physician Assistant

## 2021-05-01 ENCOUNTER — Other Ambulatory Visit: Payer: Self-pay | Admitting: Cardiovascular Disease

## 2021-08-09 ENCOUNTER — Other Ambulatory Visit: Payer: Self-pay | Admitting: Family Medicine

## 2021-08-09 ENCOUNTER — Other Ambulatory Visit: Payer: Self-pay | Admitting: Obstetrics and Gynecology

## 2021-08-09 DIAGNOSIS — Z1231 Encounter for screening mammogram for malignant neoplasm of breast: Secondary | ICD-10-CM

## 2021-08-13 ENCOUNTER — Ambulatory Visit
Admission: RE | Admit: 2021-08-13 | Discharge: 2021-08-13 | Disposition: A | Discharge status: Home / Self Care | Payer: BC Managed Care – PPO | Source: Ambulatory Visit | Attending: Obstetrics and Gynecology | Admitting: Obstetrics and Gynecology

## 2021-08-13 DIAGNOSIS — Z1231 Encounter for screening mammogram for malignant neoplasm of breast: Secondary | ICD-10-CM

## 2021-11-19 ENCOUNTER — Telehealth: Payer: Self-pay | Admitting: Cardiovascular Disease

## 2021-11-19 DIAGNOSIS — I341 Nonrheumatic mitral (valve) prolapse: Secondary | ICD-10-CM

## 2021-11-19 NOTE — Telephone Encounter (Signed)
New Message:    Patient called to schedule her appointment. In the recall notes, it said Echo before office visit. I did not see an order for the Echo. Does patient still need to have the Echo?j

## 2021-11-19 NOTE — Telephone Encounter (Signed)
Per OV on 09/17/19 w/SW: 1. Mitral valve prolapse Moderate mitral valve prolapse by echocardiogram in March 2020.  She had trivial mitral regurgitation at that time.  She has no symptoms to suggest worsening valve disease.  Continue current therapy.  She can certainly exercise and I have advised her to increase her activity slowly.  We will plan on a follow-up again in 2 years with a repeat echocardiogram prior to that visit.   Order placed at this time for ECHO. Left pt message to call back to schedule this and OV.

## 2021-11-25 ENCOUNTER — Encounter: Payer: Self-pay | Admitting: Cardiovascular Disease

## 2021-11-25 NOTE — Progress Notes (Signed)
Tara James Date of Birth  February 19, 1961       [facility] [address], [address], [place], [place]  [address]   [place], [place]  [address] [phone]     [phone]   Fax  [phone]    Fax [phone]  Problem List:  1. Mitral Valve Proplapse 2. Palpitations 3. Premature ventricular contractions.  History of Present Illness:  Tara James presents today with increased stress , fatigue and palpitations.  She has been increased stress after her father - in -law [name]) had a stroke.  She also has had lots of stress at work.  She exercises occasionally - has not had any chest pains or syncope with exercise.  December 06, 2012:  Tara James presents today for follow up of her palpitations.  She has worn the 30 day event monitor which shows many PVCs.  She has not been running but has played tennis - she felt "horrible"  Yesterday, she had significant dyspne and fatigue - even doing very mild exercise such as walking the dog.    Oct. 24, 2014:  Tara James is doing better.  She had an abdominal duplex scan after her last visit because of an abd. Bruit - the scan was negative.  Dec. 18, 2015:  Tara James has been having some left sided chest pain. Lasts several seconds, goes away on its own. She is under lots of stress at work. Exercising on occasion - not associated with the CP.    Jan. 24, 2017: Doing great.  Was exercising before her right bunion surgery .  Doing well   She retired from the business ( sold in 2013)  Tara James is now with [employer] )  Exercising regularly,   Walks regularly ,   Playing some tennis  Walks her dog  No CP , no dyspnea   Aug. 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      Cholecalciferol (VITAMIN D-3 PO) Take 1 tablet by mouth daily.      estradiol (VIVELLE-DOT) 0.05 MG/24HR patch APPLY 1 PATCH TWICE A WEEK     Multiple Vitamins-Minerals (MULTIVITAMIN WITH MINERALS) tablet Take 1 tablet by mouth daily.     progesterone (PROMETRIUM) 100 MG capsule Take 1 capsule (100 mg total) by mouth daily. (Patient taking differently: Take 100 mg by mouth at bedtime.) 90 capsule 1   propranolol (INDERAL) 10 MG tablet TAKE 1 TABLET BY MOUTH 4 TIMES A DAY AS NEEDED FOR PALPITATIONS (Patient not taking: Reported on 11/29/2021) 120 tablet 0   No current facility-administered medications on file prior to visit.    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    MVP (  mitral valve prolapse)    [facility] 2 years lov 09-17-2019   Syncope and collapse 2011   no problem since   Wears glasses     Past Surgical History:  Procedure Laterality Date   DILATATION & CURETTAGE/HYSTEROSCOPY WITH MYOSURE N/A 01/28/2021   Procedure: DILATATION & CURETTAGE/HYSTEROSCOPY WITH MYOSURE;  Surgeon: Drema Dallas, DO;  Location: [facility];  Service: Gynecology;  Laterality: N/A;   MYOMECTOMY  2001    Social History   Tobacco Use  Smoking Status Former   Packs/day: 0.50   Years: 4.00   Total pack years: 2.00   Types: Cigarettes   Quit date: 08/30/1985   Years since quitting: 36.2  Smokeless Tobacco Never    Social History   Substance and Sexual Activity  Alcohol Use Yes   Alcohol/week: 3.0 standard drinks of  alcohol   Types: 3 Glasses of wine per week   Comment: wine occ    Family History  Problem Relation Age of Onset   Heart disease Father    Diabetes Paternal Grandmother    Cancer Maternal Grandmother        breast   Colon cancer Neg Hx    Breast cancer Neg Hx     Reviw of Systems:  Reviewed in the HPI.  All other systems are negative.   Physical Exam: Blood pressure 108/68, pulse (!) 54, height '5\' 7"'$  (1.702 m), weight 120 lb 3.2 oz (54.5 kg), SpO2 99 %.  GEN:  Well nourished, well developed in no acute distress HEENT: Normal NECK: No JVD; No carotid bruits LYMPHATICS: No lymphadenopathy CARDIAC: RRR, soft systlic murmur  RESPIRATORY:  Clear to auscultation without rales, wheezing or rhonchi  ABDOMEN: Soft, non-tender, non-distended MUSCULOSKELETAL:  No edema; No deformity  SKIN: Warm and dry NEUROLOGIC:  Alert and oriented x 3    ECG:Aug. 7, 2023   , sinus bradycardia at 54.  Right axis      Assessment / Plan:   1. MVP -  moderate prolapse ,  trivial MR  At this point , she may not ever need to have MV repair   2. Let cramps:  likely due to volume depletion or electrolyte disturbances.  Suggested V-8 juice every morning        [name], MD  11/29/2021 4:43 PM    [facility],  [place], [place]  [address] Pager [phone] Phone: [phone]; Fax: [phone]

## 2021-11-26 ENCOUNTER — Ambulatory Visit (HOSPITAL_COMMUNITY): Payer: BC Managed Care – PPO | Attending: Physician Assistant

## 2021-11-26 DIAGNOSIS — I341 Nonrheumatic mitral (valve) prolapse: Secondary | ICD-10-CM | POA: Diagnosis present

## 2021-11-26 LAB — ECHOCARDIOGRAM COMPLETE
Area-P 1/2: 4.13 cm2
S' Lateral: 2.5 cm

## 2021-11-28 ENCOUNTER — Encounter: Payer: Self-pay | Admitting: Physician Assistant

## 2021-11-29 ENCOUNTER — Encounter: Payer: Self-pay | Admitting: Cardiovascular Disease

## 2021-11-29 ENCOUNTER — Ambulatory Visit: Payer: BC Managed Care – PPO | Admitting: Cardiovascular Disease

## 2021-11-29 VITALS — BP 108/68 | HR 54 | Ht 67.0 in | Wt 120.2 lb

## 2021-11-29 DIAGNOSIS — I341 Nonrheumatic mitral (valve) prolapse: Secondary | ICD-10-CM | POA: Diagnosis not present

## 2021-11-29 DIAGNOSIS — R252 Cramp and spasm: Secondary | ICD-10-CM | POA: Diagnosis not present

## 2021-11-29 NOTE — Patient Instructions (Signed)

## 2021-11-30 NOTE — Addendum Note (Signed)
Addended by: Lynn Ito on: 11/30/2021 09:03 AM   Modules accepted: Orders

## 2021-12-17 ENCOUNTER — Other Ambulatory Visit: Payer: Self-pay | Admitting: Family Medicine

## 2021-12-17 DIAGNOSIS — N6321 Unspecified lump in the left breast, upper outer quadrant: Secondary | ICD-10-CM

## 2021-12-23 ENCOUNTER — Ambulatory Visit
Admission: RE | Admit: 2021-12-23 | Discharge: 2021-12-23 | Disposition: A | Discharge status: Home / Self Care | Payer: BC Managed Care – PPO | Source: Ambulatory Visit | Attending: Family Medicine | Admitting: Family Medicine

## 2021-12-23 DIAGNOSIS — N6321 Unspecified lump in the left breast, upper outer quadrant: Secondary | ICD-10-CM

## 2022-08-18 ENCOUNTER — Other Ambulatory Visit: Payer: Self-pay | Admitting: Obstetrics and Gynecology

## 2022-08-18 DIAGNOSIS — M8580 Other specified disorders of bone density and structure, unspecified site: Secondary | ICD-10-CM

## 2022-08-25 ENCOUNTER — Ambulatory Visit
Admission: RE | Admit: 2022-08-25 | Discharge: 2022-08-25 | Disposition: A | Discharge status: Home / Self Care | Payer: BC Managed Care – PPO | Source: Ambulatory Visit | Attending: Obstetrics and Gynecology | Admitting: Obstetrics and Gynecology

## 2022-08-25 DIAGNOSIS — M8580 Other specified disorders of bone density and structure, unspecified site: Secondary | ICD-10-CM

## 2022-10-05 ENCOUNTER — Other Ambulatory Visit: Payer: Self-pay | Admitting: Obstetrics and Gynecology

## 2022-10-05 DIAGNOSIS — Z1231 Encounter for screening mammogram for malignant neoplasm of breast: Secondary | ICD-10-CM

## 2022-10-19 ENCOUNTER — Ambulatory Visit: Payer: BC Managed Care – PPO

## 2022-11-10 ENCOUNTER — Ambulatory Visit: Payer: BC Managed Care – PPO

## 2022-11-14 ENCOUNTER — Ambulatory Visit
Admission: RE | Admit: 2022-11-14 | Discharge: 2022-11-14 | Disposition: A | Discharge status: Home / Self Care | Payer: BC Managed Care – PPO | Source: Ambulatory Visit | Attending: Obstetrics and Gynecology | Admitting: Obstetrics and Gynecology

## 2022-11-14 DIAGNOSIS — Z1231 Encounter for screening mammogram for malignant neoplasm of breast: Secondary | ICD-10-CM

## 2022-12-18 ENCOUNTER — Encounter: Payer: Self-pay | Admitting: Cardiovascular Disease

## 2022-12-18 NOTE — Progress Notes (Unsigned)
Tara James Date of Birth  [birth year]       [facility]    [place] [address], [address]  [address], [address] [place], [place]  [address]   [place], [place]  [address] [phone]     [phone]   Fax  [phone]    Fax [phone]  Problem List:  1. Mitral Valve Proplapse 2. Palpitations 3. Premature ventricular contractions.  History of Present Illness:  Tara James presents today with increased stress , fatigue and palpitations.  She has been increased stress after her father - in -law Tara James) had a stroke.  She also has had lots of stress at work.  She exercises occasionally - has not had any chest pains or syncope with exercise.  December 06, 2012:  Tara James presents today for follow up of her palpitations.  She has worn the 30 day event monitor which shows many PVCs.  She has not been running but has played tennis - she felt "horrible"  Yesterday, she had significant dyspne and fatigue - even doing very mild exercise such as walking the dog.    Oct. 24, 2014:  Tara James is doing better.  She had an abdominal duplex scan after her last visit because of an abd. Bruit - the scan was negative.  Dec. 18, 2015:  Tara James has been having some left sided chest pain. Lasts several seconds, goes away on its own. She is under lots of stress at work. Exercising on occasion - not associated with the CP.    Jan. 24, 2017: Doing great.  Was exercising before her right bunion surgery .  Doing well   She retired from the business ( sold in 2013)  Tara James is now with [employer] )  Exercising regularly,   [employer] regularly ,   Playing some tennis  Walks her dog  No CP , no dyspnea   Aug. 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 26, 2024  Tara James is seen for follow up of her MVP Walks regularly     Current Outpatient Medications on File Prior to Visit  Medication Sig Dispense Refill   CALCIUM PO Take 1 tablet by mouth daily.      Cholecalciferol (VITAMIN D-3 PO) Take 1 tablet by mouth daily.      estradiol (VIVELLE-DOT) 0.05 MG/24HR patch APPLY 1 PATCH TWICE A WEEK     Multiple Vitamins-Minerals (MULTIVITAMIN WITH MINERALS) tablet Take 1 tablet by mouth daily.     progesterone (PROMETRIUM) 100 MG capsule Take 1 capsule (100 mg total) by mouth daily. (Patient taking differently: Take 100 mg by mouth at bedtime.) 90 capsule 1   propranolol (INDERAL) 10 MG tablet TAKE 1 TABLET BY MOUTH 4 TIMES A DAY AS NEEDED FOR PALPITATIONS (Patient not taking: Reported on 11/29/2021) 120 tablet 0   No current facility-administered medications on file prior to visit.    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    MVP (mitral valve prolapse)    sees Tara James q 2 years lov 09-17-2019   Syncope and collapse 2011   no problem since   Wears glasses     Past Surgical History:  Procedure Laterality Date   DILATATION & CURETTAGE/HYSTEROSCOPY WITH MYOSURE N/A 01/28/2021   Procedure: DILATATION & CURETTAGE/HYSTEROSCOPY WITH MYOSURE;  Surgeon: Steva Ready, DO;  Location: [facility];  Service: Gynecology;  Laterality: N/A;   MYOMECTOMY  2001    Social History   Tobacco Use  Smoking Status Former   Current packs/day: 0.00   Average packs/day: 0.5 packs/day for 4.0 years (2.0 ttl pk-yrs)   Types: Cigarettes   Start date: 08/30/1981   Quit date: 08/30/1985   Years since quitting:  37.3  Smokeless Tobacco Never    Social History   Substance and Sexual Activity  Alcohol Use Yes   Alcohol/week: 3.0 standard drinks of alcohol   Types: 3 Glasses of wine per week   Comment: wine occ    Family History  Problem Relation Age of Onset   Heart disease Father    Diabetes Paternal Grandmother     Reviw of Systems:  Reviewed in the HPI.  All other systems are negative.   Physical Exam: There were no vitals taken for this visit.  No BP recorded.  {Refresh Note OR Click here to enter BP  :1}***    GEN:  Well nourished, well developed in no acute distress HEENT: Normal NECK: No JVD; No carotid bruits LYMPHATICS: No lymphadenopathy CARDIAC: RRR ***, no murmurs, rubs, gallops RESPIRATORY:  Clear to auscultation without rales, wheezing or rhonchi  ABDOMEN: Soft, non-tender, non-distended MUSCULOSKELETAL:  No edema; No deformity  SKIN: Warm and dry NEUROLOGIC:  Alert and oriented x 3     ECG:         Assessment / Plan:   1. MVP -     2. Let cramps:             Tara Miss, MD  12/18/2022 6:44 AM    [facility] [address] [place],  [address] [place], [place]  [address] Pager [phone] Phone: [phone]; Fax: [phone]

## 2022-12-19 ENCOUNTER — Ambulatory Visit: Payer: BC Managed Care – PPO | Attending: Cardiovascular Disease | Admitting: Cardiovascular Disease

## 2022-12-19 VITALS — BP 108/66 | HR 68 | Ht 67.0 in | Wt 117.0 lb

## 2022-12-19 DIAGNOSIS — I341 Nonrheumatic mitral (valve) prolapse: Secondary | ICD-10-CM | POA: Diagnosis not present

## 2022-12-19 MED ORDER — PROPRANOLOL HCL 10 MG PO TABS: 10.0000 mg | ORAL_TABLET | Freq: Four times a day (QID) | ORAL | 4 refills | Status: DC | PRN

## 2022-12-19 NOTE — Patient Instructions (Signed)
Medication Instructions:   Your physician recommends that you continue on your current medications as directed. Please refer to the Current Medication list given to you today.  *If you need a refill on your cardiac medications before your next appointment, please call your pharmacy*    Follow-Up: At [facility], you and your health needs are our priority.  As part of our continuing mission to provide you with exceptional heart care, we have created designated Provider Care Teams.  These Care Teams include your primary Cardiologist (physician) and Advanced Practice Providers (APPs -  Physician Assistants and Nurse Practitioners) who all work together to provide you with the care you need, when you need it.  We recommend signing up for the patient portal called "MyChart".  Sign up information is provided on this After Visit Summary.  MyChart is used to connect with patients for Virtual Visits (Telemedicine).  Patients are able to view lab/test results, encounter notes, upcoming appointments, etc.  Non-urgent messages can be sent to your provider as well.   To learn more about what you can do with MyChart, go to [email or web address].    Your next appointment:   1 year(s)  Provider:   Philip Nahser, MD       

## 2023-03-18 ENCOUNTER — Other Ambulatory Visit: Payer: Self-pay | Admitting: Cardiovascular Disease

## 2023-11-01 ENCOUNTER — Encounter: Payer: Self-pay | Admitting: Internal Medicine

## 2023-12-22 ENCOUNTER — Encounter: Payer: Self-pay | Admitting: Internal Medicine

## 2023-12-22 ENCOUNTER — Ambulatory Visit (AMBULATORY_SURGERY_CENTER): Payer: Self-pay

## 2023-12-22 VITALS — Ht 67.0 in | Wt 118.0 lb

## 2023-12-22 DIAGNOSIS — Z1211 Encounter for screening for malignant neoplasm of colon: Secondary | ICD-10-CM

## 2023-12-22 MED ORDER — NA SULFATE-K SULFATE-MG SULF 17.5-3.13-1.6 GM/177ML PO SOLN: 1.0000 | Freq: Once | ORAL | 0 refills | Status: AC

## 2023-12-22 NOTE — Progress Notes (Signed)
 Pre visit completed via phone call; Patient verified name, DOB, and address; No egg or soy allergy known to patient;  No issues known to pt with past sedation with any surgeries or procedures; it is noted that the patient has a low HR after having sedation; Patient denies ever being told they had issues or difficulty with intubation;  No FH of Malignant Hyperthermia; Pt is not on diet pills; Pt is not on home 02;  Pt is not on blood thinners;  Pt denies issues with constipation;  No A fib or A flutter; Have any cardiac testing pending--NO Insurance verified during PV appt--- [employer] SHP Pt can ambulate without assistance;  Pt denies use of chewing tobacco; Discussed diabetic/weight loss medication holds; Discussed NSAID holds; Checked BMI to be less than 50; Pt instructed to use Singlecare.com or GoodRx for a price reduction on prep;  Patient's chart reviewed by Norleen Schillings CNRA prior to previsit and patient appropriate for the [facility]; Pre visit completed and red dot placed by patient's name on their procedure day (on provider's schedule);   Instructions sent to MyChart per patient request;

## 2024-01-05 ENCOUNTER — Ambulatory Visit (AMBULATORY_SURGERY_CENTER): Payer: Self-pay | Admitting: Internal Medicine

## 2024-01-05 ENCOUNTER — Encounter: Payer: Self-pay | Admitting: Internal Medicine

## 2024-01-05 VITALS — BP 114/57 | HR 59 | Temp 97.9°F | Resp 13 | Ht 67.0 in | Wt 118.0 lb

## 2024-01-05 DIAGNOSIS — Q439 Congenital malformation of intestine, unspecified: Secondary | ICD-10-CM

## 2024-01-05 DIAGNOSIS — D124 Benign neoplasm of descending colon: Secondary | ICD-10-CM

## 2024-01-05 DIAGNOSIS — D125 Benign neoplasm of sigmoid colon: Secondary | ICD-10-CM | POA: Diagnosis not present

## 2024-01-05 DIAGNOSIS — Z1211 Encounter for screening for malignant neoplasm of colon: Secondary | ICD-10-CM | POA: Diagnosis present

## 2024-01-05 DIAGNOSIS — K5730 Diverticulosis of large intestine without perforation or abscess without bleeding: Secondary | ICD-10-CM

## 2024-01-05 MED ORDER — SODIUM CHLORIDE 0.9 % IV SOLN: 500.0000 mL | Freq: Once | INTRAVENOUS | Status: DC

## 2024-01-05 NOTE — Patient Instructions (Signed)
 Educational handout provided to patient related to Polyps and Diverticulosis  Resume previous diet  Continue present medications  Awaiting pathology results  YOU HAD AN ENDOSCOPIC PROCEDURE TODAY AT THE [facility]:   Refer to the procedure report that was given to you for any specific questions about what was found during the examination.  If the procedure report does not answer your questions, please call your gastroenterologist to clarify.  If you requested that your care partner not be given the details of your procedure findings, then the procedure report has been included in a sealed envelope for you to review at your convenience later.  YOU SHOULD EXPECT: Some feelings of bloating in the abdomen. Passage of more gas than usual.  Walking can help get rid of the air that was put into your GI tract during the procedure and reduce the bloating. If you had a lower endoscopy (such as a colonoscopy or flexible sigmoidoscopy) you may notice spotting of blood in your stool or on the toilet paper. If you underwent a bowel prep for your procedure, you may not have a normal bowel movement for a few days.  Please Note:  You might notice some irritation and congestion in your nose or some drainage.  This is from the oxygen used during your procedure.  There is no need for concern and it should clear up in a day or so.  SYMPTOMS TO REPORT IMMEDIATELY:  Following lower endoscopy (colonoscopy or flexible sigmoidoscopy):  Excessive amounts of blood in the stool  Significant tenderness or worsening of abdominal pains  Swelling of the abdomen that is new, acute  Fever of 100F or higher  For urgent or emergent issues, a gastroenterologist can be reached at any hour by calling [phone]. Do not use MyChart messaging for urgent concerns.    DIET:  We do recommend a small meal at first, but then you may proceed to your regular diet.  Drink plenty of fluids but you should avoid alcoholic  beverages for 24 hours.  ACTIVITY:  You should plan to take it easy for the rest of today and you should NOT DRIVE or use heavy machinery until tomorrow (because of the sedation medicines used during the test).    FOLLOW UP: Our staff will call the number listed on your records the next business day following your procedure.  We will call around 7:15- 8:00 am to check on you and address any questions or concerns that you may have regarding the information given to you following your procedure. If we do not reach you, we will leave a message.     If any biopsies were taken you will be contacted by phone or by letter within the next 1-3 weeks.  Please call us  at [phone] if you have not heard about the biopsies in 3 weeks.    SIGNATURES/CONFIDENTIALITY: You and/or your care partner have signed paperwork which will be entered into your electronic medical record.  These signatures attest to the fact that that the information above on your After Visit Summary has been reviewed and is understood.  Full responsibility of the confidentiality of this discharge information lies with you and/or your care-partner.

## 2024-01-05 NOTE — Progress Notes (Signed)
 Pt's states no medical or surgical changes since previsit or office visit.

## 2024-01-05 NOTE — Progress Notes (Signed)
 Vss nad trans to pacu

## 2024-01-05 NOTE — Progress Notes (Signed)
 GASTROENTEROLOGY PROCEDURE H&P NOTE   Primary Care Physician: Rolinda Millman, MD    Reason for Procedure:  Colon cancer screening  Plan:    Colonoscopy  Patient is appropriate for endoscopic procedure(s) in the ambulatory ([facility]) setting.  The nature of the procedure, as well as the risks, benefits, and alternatives were carefully and thoroughly reviewed with the patient. Ample time for discussion and questions allowed. The patient understood, was satisfied, and agreed to proceed.     HPI: Tara James is a 63 y.o. female who presents for screening colonoscopy.  Medical history as below.  Tolerated the prep.  No recent chest pain or shortness of breath.  No abdominal pain today.  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    MVP (mitral valve prolapse)    sees scott weaver q 2 years lov 09-17-2019   Syncope and collapse 2011   no problem since   Wears glasses     Past Surgical History:  Procedure Laterality Date   COLONOSCOPY  09/2013   JMP-MAC-moviprep (good)-tics/normal/10 yr recall   DILATATION & CURETTAGE/HYSTEROSCOPY WITH MYOSURE N/A 01/28/2021   Procedure: DILATATION & CURETTAGE/HYSTEROSCOPY WITH MYOSURE;  Surgeon: Storm Setter, DO;  Location: [facility];  Service: Gynecology;  Laterality: N/A;   MYOMECTOMY  04/26/1999    Prior to Admission medications   Medication Sig Start Date End Date Taking? Authorizing Provider  CALCIUM PO Take 1 tablet by mouth daily.    Yes [provider]  Cholecalciferol (VITAMIN D -3 PO) Take 1 tablet by mouth daily.    Yes [provider]  estradiol  (VIVELLE -DOT) 0.05 MG/24HR patch APPLY 1 PATCH TWICE A  WEEK Patient taking differently: Place 1 patch onto the skin 2 (two) times a week. APPLY 1 PATCH TWICE A WEEK 10/29/21  Yes [provider]  Multiple Vitamins-Minerals (MULTIVITAMIN WITH MINERALS) tablet Take 1 tablet by mouth daily.   Yes [provider]  progesterone  (PROMETRIUM ) 200 MG capsule Take 200 mg by mouth daily.   Yes [provider]  propranolol  (INDERAL ) 10 MG tablet TAKE 1 TABLET (10 MG TOTAL) BY MOUTH 4 (FOUR) TIMES DAILY AS NEEDED (PALPITATIONS). 03/20/23   Nahser, Aleene PARAS, MD    Current Outpatient Medications  Medication Sig Dispense Refill   CALCIUM PO Take 1 tablet by mouth daily.      Cholecalciferol (VITAMIN D -3 PO) Take 1 tablet by mouth daily.      estradiol  (VIVELLE -DOT) 0.05 MG/24HR patch APPLY 1 PATCH TWICE A WEEK (Patient taking differently: Place 1 patch onto the skin 2 (two) times a week. APPLY 1 PATCH TWICE A WEEK)     Multiple Vitamins-Minerals (MULTIVITAMIN WITH MINERALS) tablet Take 1 tablet by mouth daily.     progesterone  (PROMETRIUM ) 200 MG capsule Take 200 mg by mouth daily.     propranolol  (INDERAL ) 10 MG tablet TAKE 1 TABLET (10 MG TOTAL) BY MOUTH 4 (FOUR) TIMES DAILY AS NEEDED (PALPITATIONS). 360 tablet 2   Current Facility-Administered Medications  Medication Dose Route Frequency Provider Last Rate Last Admin   0.9 %  sodium chloride  infusion  500 mL Intravenous Once Meliton Samad, Gordy HERO, MD  Allergies as of 01/05/2024 - Review Complete 01/05/2024  Allergen Reaction Noted   Clindamycin Rash 12/22/2023   Clindamycin hcl Dermatitis 12/22/2023   Clindamycin/lincomycin Rash 01/28/2021   Doxycycline Rash 09/17/2019   Doxycycline hyclate Rash 12/22/2023    Family History  Problem Relation Age of Onset   Colon polyps Mother 16   Heart disease Father    Diabetes Paternal Grandmother    Colon cancer Neg Hx    Esophageal cancer Neg Hx    Rectal cancer Neg Hx    Stomach cancer Neg Hx     Social History   Socioeconomic  History   Marital status: Married    Spouse name: Not on file   Number of children: Not on file   Years of education: Not on file   Highest education level: Not on file  Occupational History   Not on file  Tobacco Use   Smoking status: Former    Current packs/day: 0.00    Average packs/day: 0.5 packs/day for 4.0 years (2.0 ttl pk-yrs)    Types: Cigarettes    Start date: 08/30/1981    Quit date: 08/30/1985    Years since quitting: 38.3   Smokeless tobacco: Never  Vaping Use   Vaping status: Never Used  Substance and Sexual Activity   Alcohol use: Yes    Alcohol/week: 3.0 standard drinks of alcohol    Types: 3 Glasses of wine per week    Comment: wine occ   Drug use: No   Sexual activity: Yes    Partners: Male  Other Topics Concern   Not on file  Social History Narrative   Not on file   Social Drivers of Health   Financial Resource Strain: Not on file  Food Insecurity: Not on file  Transportation Needs: Not on file  Physical Activity: Not on file  Stress: Not on file  Social Connections: Not on file  Intimate Partner Violence: Not on file    Physical Exam: Vital signs in last 24 hours: @BP  111/69   Pulse 88   Temp 97.9 F (36.6 C) (Temporal)   Ht 5' 7 (1.702 m)   Wt 118 lb (53.5 kg)   SpO2 97%   BMI 18.48 kg/m  GEN: NAD EYE: Sclerae anicteric ENT: MMM CV: Non-tachycardic Pulm: CTA b/l GI: Soft, NT/ND NEURO:  Alert & Oriented x 3   Gordy Starch, MD [facility]  01/05/2024 11:46 AM

## 2024-01-05 NOTE — Progress Notes (Signed)
 Called to room to assist during endoscopic procedure.  Patient ID and intended procedure confirmed with present staff. Received instructions for my participation in the procedure from the performing physician.

## 2024-01-05 NOTE — Op Note (Signed)
 [facility] Patient Name: Tara James Procedure Date: 01/05/2024 11:50 AM MRN: [id number] Endoscopist: Gordy CHRISTELLA Starch , MD, [id number] Age: 63 Referring MD:  Date of Birth: Jun 17, 1960 Gender: Female Account #: [id number] Procedure:                Colonoscopy Indications:              Screening for colorectal malignant neoplasm, Last                            colonoscopy 10 years ago Medicines:                Monitored Anesthesia Care Procedure:                Pre-Anesthesia Assessment:                           - Prior to the procedure, a History and Physical                            was performed, and patient medications and                            allergies were reviewed. The patient's tolerance of                            previous anesthesia was also reviewed. The risks                            and benefits of the procedure and the sedation                            options and risks were discussed with the patient.                            All questions were answered, and informed consent                            was obtained. Prior Anticoagulants: The patient has                            taken no anticoagulant or antiplatelet agents. ASA                            Grade Assessment: II - A patient with mild systemic                            disease. After reviewing the risks and benefits,                            the patient was deemed in satisfactory condition to                            undergo the procedure.  After obtaining informed consent, the colonoscope                            was passed under direct vision. Throughout the                            procedure, the patient's blood pressure, pulse, and                            oxygen saturations were monitored continuously. The                            Olympus Scope [id number] was introduced through the                            anus and advanced to the cecum,  identified by                            appendiceal orifice and ileocecal valve. The                            colonoscopy was somewhat difficult due to a                            tortuous colon. Successful completion of the                            procedure was aided by applying abdominal pressure.                            The patient tolerated the procedure well. The                            quality of the bowel preparation was good. The                            ileocecal valve, appendiceal orifice, and rectum                            were photographed. Scope In: 11:59:31 AM Scope Out: 12:21:42 PM Scope Withdrawal Time: 0 hours 10 minutes 45 seconds  Total Procedure Duration: 0 hours 22 minutes 11 seconds  Findings:                 The digital rectal exam was normal.                           A 5 mm polyp was found in the descending colon. The                            polyp was sessile. The polyp was removed with a                            cold snare. Resection and retrieval were complete.  A 4 mm polyp was found in the sigmoid colon. The                            polyp was removed with a cold snare. Resection and                            retrieval were complete.                           Multiple small-mouthed diverticula were found in                            the sigmoid colon and descending colon.                           The retroflexed view of the distal rectum and anal                            verge was normal and showed no anal or rectal                            abnormalities. Complications:            No immediate complications. Estimated Blood Loss:     Estimated blood loss was minimal. Impression:               - One 5 mm polyp in the descending colon, removed                            with a cold snare. Resected and retrieved.                           - One 4 mm polyp in the sigmoid colon, removed with                             a cold snare. Resected and retrieved.                           - Mild diverticulosis in the sigmoid colon and in                            the descending colon.                           - The distal rectum and anal verge are normal on                            retroflexion view. Recommendation:           - Patient has a contact number available for                            emergencies. The signs and symptoms of potential  delayed complications were discussed with the                            patient. Return to normal activities tomorrow.                            Written discharge instructions were provided to the                            patient.                           - Resume previous diet.                           - Continue present medications.                           - Await pathology results.                           - Repeat colonoscopy is recommended for                            surveillance. The colonoscopy date will be                            determined after pathology results from today's                            exam become available for review. Gordy CHRISTELLA Starch, MD 01/05/2024 12:26:36 PM This report has been signed electronically.

## 2024-01-08 ENCOUNTER — Telehealth: Payer: Self-pay

## 2024-01-08 NOTE — Telephone Encounter (Signed)
  Follow up Call-     01/05/2024   10:52 AM  Call back number  Post procedure Call Back phone  # [phone]  Permission to leave phone message Yes     Patient questions:  Do you have a fever, pain , or abdominal swelling? No. Pain Score  0 *  Have you tolerated food without any problems? YES  Have you been able to return to your normal activities? Yes.    Do you have any questions about your discharge instructions: Diet   No. Medications  No. Follow up visit  No.  Do you have questions or concerns about your Care? No.  Actions: * If pain score is 4 or above: No action needed, pain <4.

## 2024-01-09 LAB — SURGICAL PATHOLOGY

## 2024-01-19 ENCOUNTER — Ambulatory Visit: Payer: Self-pay | Admitting: Internal Medicine

## 2024-03-15 ENCOUNTER — Other Ambulatory Visit: Payer: Self-pay | Admitting: Orthopedic Surgery

## 2024-03-15 DIAGNOSIS — Z1231 Encounter for screening mammogram for malignant neoplasm of breast: Secondary | ICD-10-CM

## 2024-03-29 ENCOUNTER — Ambulatory Visit
Admission: RE | Admit: 2024-03-29 | Discharge: 2024-03-29 | Disposition: A | Source: Ambulatory Visit | Attending: Orthopedic Surgery

## 2024-03-29 ENCOUNTER — Other Ambulatory Visit: Payer: Self-pay | Admitting: Family Medicine

## 2024-03-29 DIAGNOSIS — Z1231 Encounter for screening mammogram for malignant neoplasm of breast: Secondary | ICD-10-CM

## 2024-04-08 ENCOUNTER — Other Ambulatory Visit: Payer: Self-pay | Admitting: Nurse Practitioner

## 2024-04-08 ENCOUNTER — Encounter: Payer: Self-pay | Admitting: Nurse Practitioner

## 2024-04-08 DIAGNOSIS — N950 Postmenopausal bleeding: Secondary | ICD-10-CM

## 2024-04-29 ENCOUNTER — Ambulatory Visit: Admitting: Internal Medicine
